# Patient Record
Sex: Female | Born: 1965 | Race: White | Hispanic: No | Marital: Married | Smoking: Former smoker
Health system: Southern US, Community
[De-identification: ages and names within clinical notes are randomized; demographics above are authoritative.]

## PROBLEM LIST (undated history)

## (undated) DIAGNOSIS — E119 Type 2 diabetes mellitus without complications: Secondary | ICD-10-CM

## (undated) DIAGNOSIS — K5790 Diverticulosis of intestine, part unspecified, without perforation or abscess without bleeding: Secondary | ICD-10-CM

## (undated) DIAGNOSIS — K589 Irritable bowel syndrome without diarrhea: Secondary | ICD-10-CM

## (undated) DIAGNOSIS — K649 Unspecified hemorrhoids: Secondary | ICD-10-CM

## (undated) DIAGNOSIS — K219 Gastro-esophageal reflux disease without esophagitis: Secondary | ICD-10-CM

## (undated) DIAGNOSIS — K760 Fatty (change of) liver, not elsewhere classified: Secondary | ICD-10-CM

## (undated) DIAGNOSIS — C3490 Malignant neoplasm of unspecified part of unspecified bronchus or lung: Secondary | ICD-10-CM

## (undated) DIAGNOSIS — Z889 Allergy status to unspecified drugs, medicaments and biological substances status: Secondary | ICD-10-CM

## (undated) DIAGNOSIS — I10 Essential (primary) hypertension: Secondary | ICD-10-CM

## (undated) HISTORY — DX: Diverticulosis of intestine, part unspecified, without perforation or abscess without bleeding: K57.90

## (undated) HISTORY — DX: Malignant neoplasm of unspecified part of unspecified bronchus or lung: C34.90

## (undated) HISTORY — DX: Gastro-esophageal reflux disease without esophagitis: K21.9

## (undated) HISTORY — DX: Allergy status to unspecified drugs, medicaments and biological substances: Z88.9

## (undated) HISTORY — DX: Essential (primary) hypertension: I10

## (undated) HISTORY — DX: Unspecified hemorrhoids: K64.9

## (undated) HISTORY — DX: Irritable bowel syndrome, unspecified: K58.9

---

## 2009-04-07 HISTORY — PX: KNEE ARTHROSCOPY: SUR90

## 2010-01-04 ENCOUNTER — Ambulatory Visit
Admission: RE | Admit: 2010-01-04 | Discharge: 2010-01-04 | Payer: Self-pay | Source: Home / Self Care | Admitting: Specialist

## 2010-06-20 LAB — POCT I-STAT 4, (NA,K, GLUC, HGB,HCT)
Glucose, Bld: 114 mg/dL — ABNORMAL HIGH (ref 70–99)
HCT: 42 % (ref 36.0–46.0)
Potassium: 3.6 mEq/L (ref 3.5–5.1)

## 2010-10-04 DIAGNOSIS — R079 Chest pain, unspecified: Secondary | ICD-10-CM

## 2011-08-06 ENCOUNTER — Encounter (INDEPENDENT_AMBULATORY_CARE_PROVIDER_SITE_OTHER): Payer: Self-pay | Admitting: *Deleted

## 2011-09-16 ENCOUNTER — Encounter (INDEPENDENT_AMBULATORY_CARE_PROVIDER_SITE_OTHER): Payer: Self-pay | Admitting: Internal Medicine

## 2011-09-16 ENCOUNTER — Ambulatory Visit (INDEPENDENT_AMBULATORY_CARE_PROVIDER_SITE_OTHER): Payer: PRIVATE HEALTH INSURANCE | Admitting: Internal Medicine

## 2011-09-16 VITALS — BP 128/72 | HR 76 | Temp 97.7°F | Resp 20 | Ht 69.0 in | Wt 338.6 lb

## 2011-09-16 DIAGNOSIS — K589 Irritable bowel syndrome without diarrhea: Secondary | ICD-10-CM | POA: Insufficient documentation

## 2011-09-16 DIAGNOSIS — E669 Obesity, unspecified: Secondary | ICD-10-CM | POA: Insufficient documentation

## 2011-09-16 DIAGNOSIS — R1031 Right lower quadrant pain: Secondary | ICD-10-CM

## 2011-09-16 DIAGNOSIS — I10 Essential (primary) hypertension: Secondary | ICD-10-CM | POA: Insufficient documentation

## 2011-09-16 DIAGNOSIS — R112 Nausea with vomiting, unspecified: Secondary | ICD-10-CM

## 2011-09-16 DIAGNOSIS — R197 Diarrhea, unspecified: Secondary | ICD-10-CM

## 2011-09-16 MED ORDER — ALIGN PO CAPS: 1.0000 | ORAL_CAPSULE | Freq: Every day | ORAL | Status: AC

## 2011-09-16 MED ORDER — DICYCLOMINE HCL 10 MG PO CAPS: 10.0000 mg | ORAL_CAPSULE | Freq: Two times a day (BID) | ORAL | Status: DC

## 2011-09-16 NOTE — Patient Instructions (Addendum)
High fiber diet. Take dicyclomine 10 mg by mouth before breakfast and lunch or before lunch and evening meal. Keep symptom diary until office visit.

## 2011-09-16 NOTE — Consult Note (Signed)
Presenting complaint; Intermittent diarrhea and abdominal pain. History of present illness; Tanya Galloway is a 46 year old Caucasian female who was referred through courtesy of Tanya Port NP/Dr. Ruthy Galloway for GI evaluation. Patient symptoms initially started 5 or 6 years ago when she developed postprandial diarrhea and was told she had irritable bowel syndrome. At that time she was being cared for at [facility] internal medicine. She was treated with dicyclomine and felt better. She did fine for about 2 years off any treatment and then developed diarrhea as well as pain at left lower quadrant of her abdomen. Her symptoms are intermittent. She had an episode that worsening pain when she was seen at [facility] internal medicine and diagnosed with diverticulitis. She recalls that her white cell count was elevated. She is not sure whether or not she had CT. She was treated with antibiotics and her acute symptoms resolved. Since then she's been experiencing intermittent episodes of diarrhea and abdominal cramps primarily across lower abdomen and also at LLQ. During these episodes she has nonbloody diarrhea for within 10 minutes of her meals. She has used Pepto-Bismol in the past which seemed to help. Her last episode was one month ago. Presently she is having one stool daily the consistency varies from normal to soft stool. She also gives history of constipation which does not occur often easily relieved with dietary changes and prunes. She also complains of intermittent bloating. She has very good appetite but trying to lose weight. This is the most that she has ever weighed. She she is having much less heartburn since she quit cigarette smoking. She denies dysphagia or nocturnal diarrhea. She wonders if her work schedule but changing shifts and eating habits contribution to her symptoms. She takes her blood pressure medication at night in past to get up multiple times to urinate. She takes no more than 3 or 4 doses of naproxen  in a month. Current medications; Diphenhydramine/acetaminophen 25/500 one tablet by mouth each bedtime when necessary. Nexium 40 mg by mouth every morning. Lisinopril/HCTZ 10/12.5 mg by mouth daily. Naproxen sodium to 20 mg by mouth daily when necessary. Phenylephrine 5 mg by mouth daily. Past medical history; History of bronchial asthma has not had an episode in several months. Hypertension of 18 months duration. Chronic GERD. Seasonal allergies. History of uterine fibroids. Obesity. History of diverticulitis as above. Left knee arthroscopy in September 2011. Allergies; Sulfa. Family history; Both parents have hypertension. Father is 23 years old and mother is 28. They both have had colonic polyps removed. She has 2 brothers in good health and one is hypertensive. Social history; She is married does not have any children. She smoked cigarettes for 20 years more than a pack per day but quit 2 years ago. She does not drink alcohol. 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 Objective; BP 128/72  Pulse 76  Temp(Src) 97.7 F (36.5 C) (Oral)  Resp 20  Ht 5\' 9"  (1.753 m)  Wt 338 lb 9.6 oz (153.588 kg)  BMI 50.00 kg/m2  LMP 08/25/2011 Conjunctival is pink. Sclerae nonicteric.  Oral pharyngeal mucosa is normal. No neck masses or thyromegaly noted.. Cardiac exam regular rhythm normal S1 and S2. No murmur or gallop noted. Lungs are clear to auscultation. Abdomen is obese. Bowel sounds are normal. On palpation is soft and nontender without organomegaly or masses. Rectal examination deferred. No clubbing or peripheral  edema noted. Assessment; Tanya Galloway is a 46 year old Caucasian female who presents with intermittent nonbloody diarrhea associated with urgency and abdominal pain predominantly in the left lower quadrant of her abdomen. She also has been treated  for diverticulitis in the past but not within the last 2-3 years. She has not had an episode of diarrhea or abdominal pain for the last one month. she does not have any alarm symptoms. Her recurrent symptoms would appear to be secondary to irritable bowel syndrome. She may have to be evaluated while she is having acute symptoms determine whether she has additional robin or diagnosis. Chronic GERD. She appears to be doing well with dietary measures and Nexium. She must double her efforts to lose weight. Recommendations; Will request abdominal CT or other imaging studies. Dicyclomine 10 mg by mouth before breakfast and lunch. Continue high fiber diet. Align one capsule by mouth daily; samples given. Symptom diary until office visit in 12 weeks. She will call for earlier appointment should she develop acute symptoms. We appreciate the opportunity to participate in the care of this nice lady.   Marland Kitchen

## 2011-09-20 NOTE — Consult Note (Addendum)
Presenting complaint; Intermittent diarrhea and abdominal pain. History of present illness; Tanya Galloway is a 46-year-old Caucasian female who was referred through courtesy of Tanya Pace NP/Tanya Galloway for GI evaluation. Patient symptoms initially started 5 or 6 years ago when she developed postprandial diarrhea and was told she had irritable bowel syndrome. At that time she was being [facility]. She was treated with dicyclomine and felt better. She did fine for about 2 years off any treatment and then developed diarrhea as well as pain at left lower quadrant of her abdomen. Her symptoms are intermittent. She had an episode that worsening pain when she was seen at [facility] and diagnosed with diverticulitis. She recalls that her white cell count was elevated. She is not sure whether or not she had CT. She was treated with antibiotics and her acute symptoms resolved. Since then she's been experiencing intermittent episodes of diarrhea and abdominal cramps primarily across lower abdomen and also at LLQ. During these episodes she has nonbloody diarrhea for within 10 minutes of her meals. She has used Pepto-Bismol in the past which seemed to help. Her last episode was one month ago. Presently she is having one stool daily the consistency varies from normal to soft stool. She also gives history of constipation which does not occur often easily relieved with dietary changes and prunes. She also complains of intermittent bloating. She has very good appetite but trying to lose weight. This is the most that she has ever weighed. She she is having much less heartburn since she quit cigarette smoking. She denies dysphagia or nocturnal diarrhea. She wonders if her work schedule but changing shifts and eating habits contribution to her symptoms. She takes her blood pressure medication at night in past to get up multiple times to urinate. She takes no more than 3 or 4 doses of naproxen  in a month. Current medications; Diphenhydramine/acetaminophen 25/500 one tablet by mouth each bedtime when necessary. Nexium 40 mg by mouth every morning. Lisinopril/HCTZ 10/12.5 mg by mouth daily. Naproxen sodium to 20 mg by mouth daily when necessary. Phenylephrine 5 mg by mouth daily. Past medical history; History of bronchial asthma has not had an episode in several months. Hypertension of 18 months duration. Chronic GERD. Seasonal allergies. History of uterine fibroids. Obesity. History of diverticulitis as above. Left knee arthroscopy in September 2011. Allergies; Sulfa. Family history; Both parents have hypertension. Father is 76 years old and mother is 74. They both have had colonic polyps removed. She has 2 brothers in good health and one is hypertensive. Social history; She is married does not have any children. She smoked cigarettes for 20 years more than a pack per day but quit 2 years ago. She does not drink alcohol. 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 Objective; BP 128/72  Pulse 76  Temp(Src) 97.7 F (36.5 C) (Oral)  Resp 20  Ht 5' 9" (1.753 m)  Wt 338 lb 9.6 oz (153.588 kg)  BMI 50.00 kg/m2  LMP 08/25/2011 Conjunctival is pink. Sclerae nonicteric.  Oral pharyngeal mucosa is normal. No neck masses or thyromegaly noted.. Cardiac exam regular rhythm normal S1 and S2. No murmur or gallop noted. Lungs are clear to auscultation. Abdomen is obese. Bowel sounds are normal. On palpation is soft and nontender without organomegaly or masses. Rectal examination deferred. No clubbing or peripheral   edema noted. Assessment; Tanya Galloway is a 46-year-old Caucasian female who presents with intermittent nonbloody diarrhea associated with urgency and abdominal pain predominantly in the left lower quadrant of her abdomen. She also has been treated  for diverticulitis in the past but not within the last 2-3 years. She has not had an episode of diarrhea or abdominal pain for the last one month. she does not have any alarm symptoms. Her recurrent symptoms would appear to be secondary to irritable bowel syndrome. She may have to be evaluated while she is having acute symptoms determine whether she has additional robin or diagnosis. Chronic GERD. She appears to be doing well with dietary measures and Nexium. She must double her efforts to lose weight. Recommendations; Will request abdominal CT or other imaging studies. Dicyclomine 10 mg by mouth before breakfast and lunch. Continue high fiber diet. Align one capsule by mouth daily; samples given. Symptom diary until office visit in 12 weeks. She will call for earlier appointment should she develop acute symptoms. We appreciate the opportunity to participate in the care of this nice lady.   .  

## 2011-09-23 NOTE — Progress Notes (Signed)
Presenting complaint; Intermittent diarrhea and abdominal pain. History of present illness; Tanya Galloway is a 46-year-old Caucasian female who was referred through courtesy of Tanya Pace NP/Tanya Galloway for GI evaluation. Patient symptoms initially started 5 or 6 years ago when she developed postprandial diarrhea and was told she had irritable bowel syndrome. At that time she was being cared for at [facility]. She was treated with dicyclomine and felt better. She did fine for about 2 years off any treatment and then developed diarrhea as well as pain at left lower quadrant of her abdomen. Her symptoms are intermittent. She had an episode that worsening pain when she was seen at [facility] and diagnosed with diverticulitis. She recalls that her white cell count was elevated. She is not sure whether or not she had CT. She was treated with antibiotics and her acute symptoms resolved. Since then she's been experiencing intermittent episodes of diarrhea and abdominal cramps primarily across lower abdomen and also at LLQ. During these episodes she has nonbloody diarrhea for within 10 minutes of her meals. She has used Pepto-Bismol in the past which seemed to help. Her last episode was one month ago. Presently she is having one stool daily the consistency varies from normal to soft stool. She also gives history of constipation which does not occur often easily relieved with dietary changes and prunes. She also complains of intermittent bloating. She has very good appetite but trying to lose weight. This is the most that she has ever weighed. She she is having much less heartburn since she quit cigarette smoking. She denies dysphagia or nocturnal diarrhea. She wonders if her work schedule but changing shifts and eating habits contribution to her symptoms. She takes her blood pressure medication at night in past to get up multiple times to urinate. She takes no more than 3 or 4 doses of naproxen  in a month. Current medications; Diphenhydramine/acetaminophen 25/500 one tablet by mouth each bedtime when necessary. Nexium 40 mg by mouth every morning. Lisinopril/HCTZ 10/12.5 mg by mouth daily. Naproxen sodium to 20 mg by mouth daily when necessary. Phenylephrine 5 mg by mouth daily. Past medical history; History of bronchial asthma has not had an episode in several months. Hypertension of 18 months duration. Chronic GERD. Seasonal allergies. History of uterine fibroids. Obesity. History of diverticulitis as above. Left knee arthroscopy in September 2011. Allergies; Sulfa. Family history; Both parents have hypertension. Father is 76 years old and mother is 74. They both have had colonic polyps removed. She has 2 brothers in good health and one is hypertensive. Social history; She is married does not have any children. She smoked cigarettes for 20 years more than a pack per day but quit 2 years ago. She does not drink alcohol. 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 Objective; BP 128/72  Pulse 76  Temp(Src) 97.7 F (36.5 C) (Oral)  Resp 20  Ht 5' 9" (1.753 m)  Wt 338 lb 9.6 oz (153.588 kg)  BMI 50.00 kg/m2  LMP 08/25/2011 Conjunctival is pink. Sclerae nonicteric.  Oral pharyngeal mucosa is normal. No neck masses or thyromegaly noted.. Cardiac exam regular rhythm normal S1 and S2. No murmur or gallop noted. Lungs are clear to auscultation. Abdomen is obese. Bowel sounds are normal. On palpation is soft and nontender without organomegaly or masses. Rectal examination deferred. No clubbing or peripheral   edema noted. Assessment; Tanya Galloway is a 46-year-old Caucasian female who presents with intermittent nonbloody diarrhea associated with urgency and abdominal pain predominantly in the left lower quadrant of her abdomen. She also has been treated  for diverticulitis in the past but not within the last 2-3 years. She has not had an episode of diarrhea or abdominal pain for the last one month. she does not have any alarm symptoms. Her recurrent symptoms would appear to be secondary to irritable bowel syndrome. She may have to be evaluated while she is having acute symptoms determine whether she has additional robin or diagnosis. Chronic GERD. She appears to be doing well with dietary measures and Nexium. She must double her efforts to lose weight. Recommendations; Will request abdominal CT or other imaging studies. Dicyclomine 10 mg by mouth before breakfast and lunch. Continue high fiber diet. Align one capsule by mouth daily; samples given. Symptom diary until office visit in 12 weeks. She will call for earlier appointment should she develop acute symptoms. We appreciate the opportunity to participate in the care of this nice lady.   .  

## 2011-11-05 ENCOUNTER — Telehealth (INDEPENDENT_AMBULATORY_CARE_PROVIDER_SITE_OTHER): Payer: Self-pay | Admitting: *Deleted

## 2011-11-05 NOTE — Telephone Encounter (Signed)
Patient was called ,message was left on her voicemail, that her hemoccult was negative. Dr.Rehman will be made and any further recommendations either Dr.Rehman or myself would call her back.

## 2011-11-13 ENCOUNTER — Telehealth: Payer: Self-pay | Admitting: *Deleted

## 2011-11-13 DIAGNOSIS — K589 Irritable bowel syndrome without diarrhea: Secondary | ICD-10-CM

## 2011-11-13 NOTE — Telephone Encounter (Signed)
Called pt and told her to contact Dr Patty Sermons office

## 2011-11-13 NOTE — Telephone Encounter (Signed)
This is a Rehman pt.  She has not been seen here. Please call her & ask her to contact them.Thanks

## 2011-11-13 NOTE — Telephone Encounter (Signed)
Tanya Galloway called today. She needs a refill on her dicyclomine. She uses [facility] in [place]. They were suppose to send over the request. She has 2 pills left. Thanks.

## 2011-12-09 ENCOUNTER — Ambulatory Visit (INDEPENDENT_AMBULATORY_CARE_PROVIDER_SITE_OTHER): Payer: PRIVATE HEALTH INSURANCE | Admitting: Internal Medicine

## 2011-12-12 ENCOUNTER — Encounter (INDEPENDENT_AMBULATORY_CARE_PROVIDER_SITE_OTHER): Payer: Self-pay

## 2011-12-15 ENCOUNTER — Ambulatory Visit (INDEPENDENT_AMBULATORY_CARE_PROVIDER_SITE_OTHER): Payer: PRIVATE HEALTH INSURANCE | Admitting: Internal Medicine

## 2011-12-15 ENCOUNTER — Encounter (INDEPENDENT_AMBULATORY_CARE_PROVIDER_SITE_OTHER): Payer: Self-pay | Admitting: Internal Medicine

## 2011-12-15 VITALS — BP 126/72 | HR 76 | Temp 97.4°F | Resp 20 | Ht 69.0 in | Wt 327.4 lb

## 2011-12-15 DIAGNOSIS — K219 Gastro-esophageal reflux disease without esophagitis: Secondary | ICD-10-CM | POA: Insufficient documentation

## 2011-12-15 DIAGNOSIS — K589 Irritable bowel syndrome without diarrhea: Secondary | ICD-10-CM

## 2011-12-15 MED ORDER — ESOMEPRAZOLE MAGNESIUM 40 MG PO CPDR: 40.0000 mg | DELAYED_RELEASE_CAPSULE | Freq: Every day | ORAL | Status: DC

## 2011-12-15 MED ORDER — DICYCLOMINE HCL 10 MG PO CAPS: 10.0000 mg | ORAL_CAPSULE | Freq: Two times a day (BID) | ORAL | Status: DC

## 2011-12-15 NOTE — Patient Instructions (Addendum)
Starting in January 2014 use dicyclomine on as-needed basis.

## 2011-12-15 NOTE — Progress Notes (Signed)
Presenting complaint;  Followup for diarrhea and abdominal pain. Subjective:  Patient is 46 year old Caucasian female who was initially seen 3 months ago for intermittent diarrhea and abdominal cramps. She was felt to have irritable bowel syndrome. We did review her records and determine that she never had abdominopelvic CT. She feels a lot better. She Tarry for a few weeks but then stopped doing it since he was not having diarrhea or abdominal pain. She is having 1-2 formed stools daily. Early on she had transient problem with constipation but it has resolved. She has lost 11 pounds since her last visit. Her breakfast and lunch include a shake which has fiber  In it and she has fruit for snack in between she has a regular meal in the evening. She is having no side effects with dicyclomine. She would like to get new prescription for Nexium. She was initially begun on this medication by Dr. Donzetta Sprung a she does not have appointment in near future.  Current Medications: Current Outpatient Prescriptions  Medication Sig Dispense Refill  . bifidobacterium infantis (ALIGN) capsule Take 1 capsule by mouth daily.  28 capsule  0  . dicyclomine (BENTYL) 10 MG capsule Take 1 capsule (10 mg total) by mouth 2 (two) times daily before a meal.  90 capsule  0  . diphenhydramine-acetaminophen (TYLENOL PM) 25-500 MG TABS Take 1 tablet by mouth at bedtime as needed.      Marland Kitchen esomeprazole (NEXIUM) 40 MG capsule Take 40 mg by mouth daily before breakfast.      . lisinopril-hydrochlorothiazide (PRINZIDE,ZESTORETIC) 10-12.5 MG per tablet Take 1 tablet by mouth daily.      . naproxen sodium (ANAPROX) 220 MG tablet Take 220 mg by mouth as needed.      . phenylephrine (SUDAFED PE) 10 MG TABS Take 5 mg by mouth daily.         Objective: Blood pressure 126/72, pulse 76, temperature 97.4 F (36.3 C), temperature source Oral, resp. rate 20, height 5\' 9"  (1.753 m), weight 327 lb 6.4 oz (148.508 kg), last menstrual period  12/07/2011. Conjunctiva is pink. Sclera is nonicteric Oropharyngeal mucosa is normal. No neck masses or thyromegaly noted. Abdomen is full. Bowel sounds are normal. On palpation soft abdomen without tenderness organomegaly or masses.  No LE edema or clubbing noted.    Assessment: #1. Irritable bowel syndrome. She has responded nicely to therapy with resolution of her diarrhea and abdominal pain. She should continue dicyclomine for  few more months and there after use it on when necessary basis. She should continue intake of fiber rich foods and align for now. #2. GERD. Symptoms well controlled with Nexium.   Plan:  Continue dicyclomine at current dose for another 4 months and thereafter on as-needed basis. New prescription for dicyclomine sent to her pharmacy. New prescription for Nexium 40 mg by mouth every morning one month with 11 refills sent to her pharmacy. Office visit on as-needed basis. However if she needs to stay on dicyclomine twice a day should return for follow visit in one year.

## 2012-09-23 ENCOUNTER — Telehealth (HOSPITAL_COMMUNITY): Payer: Self-pay | Admitting: Dietician

## 2012-09-23 NOTE — Telephone Encounter (Signed)
Received voicemail from pt left at [phone]. Pt requesting to attend DM class next Thursday, however, class is not scheduled due to provider absence. Called back and left message on pt voicemail at [phone] and provided information on next 2 available class dates on 7/1 and 7/10.

## 2012-10-04 NOTE — Telephone Encounter (Signed)
No response from pt. Will close out.  

## 2012-12-28 ENCOUNTER — Other Ambulatory Visit: Payer: Self-pay

## 2012-12-28 DIAGNOSIS — Z1231 Encounter for screening mammogram for malignant neoplasm of breast: Secondary | ICD-10-CM

## 2013-01-24 ENCOUNTER — Ambulatory Visit
Admission: RE | Admit: 2013-01-24 | Discharge: 2013-01-24 | Disposition: A | Discharge status: Home / Self Care | Payer: PRIVATE HEALTH INSURANCE | Source: Ambulatory Visit

## 2013-01-24 DIAGNOSIS — Z1231 Encounter for screening mammogram for malignant neoplasm of breast: Secondary | ICD-10-CM

## 2013-04-07 ENCOUNTER — Other Ambulatory Visit (INDEPENDENT_AMBULATORY_CARE_PROVIDER_SITE_OTHER): Payer: Self-pay | Admitting: Internal Medicine

## 2013-07-18 ENCOUNTER — Ambulatory Visit (INDEPENDENT_AMBULATORY_CARE_PROVIDER_SITE_OTHER): Payer: BC Managed Care – PPO | Admitting: Internal Medicine

## 2013-07-18 ENCOUNTER — Encounter (INDEPENDENT_AMBULATORY_CARE_PROVIDER_SITE_OTHER): Payer: Self-pay | Admitting: Internal Medicine

## 2013-07-18 VITALS — BP 124/72 | HR 78 | Temp 97.0°F | Resp 20 | Ht 69.0 in | Wt 327.8 lb

## 2013-07-18 DIAGNOSIS — K219 Gastro-esophageal reflux disease without esophagitis: Secondary | ICD-10-CM

## 2013-07-18 DIAGNOSIS — R109 Unspecified abdominal pain: Secondary | ICD-10-CM

## 2013-07-18 DIAGNOSIS — K589 Irritable bowel syndrome without diarrhea: Secondary | ICD-10-CM

## 2013-07-18 MED ORDER — DICYCLOMINE HCL 10 MG PO CAPS: 10.0000 mg | ORAL_CAPSULE | Freq: Two times a day (BID) | ORAL | Status: AC

## 2013-07-18 NOTE — Patient Instructions (Signed)
Call if abdominal pain not resolved within two weeks or if it gets worse

## 2013-07-18 NOTE — Progress Notes (Signed)
Presenting complaint;  Followup for GERD and IBS.  Subjective:  Patient is 48 year old Caucasian female who presents for scheduled visit. She was last seen on 12/15/2011. She has had symptoms of heartburn for 5 years. She is watching her diet. She is now using OTC Nexium every other day and she believes her heart went well controlled. She denies dysphagia sore throat, cough or hoarseness. She is taking She did try to stop the dicyclomine but her diarrhea and cramps relapse. She denies melena or rectal bleeding. She hasn't lost any weight since last visit. She says she gained over 40 pounds when she quit cigarette smoking but hasn't been successful at weight loss. She used to go through wire for exercises and swimming but this has been on hold since her job hours have changed. She has noted intermittent pain in the right upper quadrant laterally below the costal margin. Spleen is not associated with nausea vomiting hematuria or dysuria. Pain seemed to get worse with certain movements.   Current Medications: Outpatient Encounter Prescriptions as of 07/18/2013  Medication Sig  . dicyclomine (BENTYL) 10 MG capsule TAKE ONE CAPSULE BY MOUTH TWICE DAILY BEFORE A MEAL  . diphenhydramine-acetaminophen (TYLENOL PM) 25-500 MG TABS Take 1 tablet by mouth at bedtime as needed.  Marland Kitchen esomeprazole (NEXIUM) 40 MG capsule Take 1 capsule (40 mg total) by mouth daily before breakfast.  . lisinopril-hydrochlorothiazide (PRINZIDE,ZESTORETIC) 20-12.5 MG per tablet Take 1 tablet by mouth daily.  . naproxen sodium (ANAPROX) 220 MG tablet Take 220 mg by mouth as needed.  Marland Kitchen OVER THE COUNTER MEDICATION [facility]  . phenylephrine (SUDAFED PE) 10 MG TABS Take 5 mg by mouth daily.  . traMADol (ULTRAM) 50 MG tablet Take 50 mg by mouth as needed. For back pain  . [DISCONTINUED] lisinopril-hydrochlorothiazide (PRINZIDE,ZESTORETIC) 10-12.5 MG per tablet Take 1 tablet by mouth daily.     Objective: Blood  pressure 124/72, pulse 78, temperature 97 F (36.1 C), temperature source Oral, resp. rate 20, height 5\' 9"  (1.753 m), weight 327 lb 12.8 oz (148.689 kg), last menstrual period 06/28/2013. Patient is alert and in no acute distress. Conjunctiva is pink. Sclera is nonicteric Oropharyngeal mucosa is normal. No neck masses or thyromegaly noted. Cardiac exam with regular rhythm normal S1 and S2. No murmur or gallop noted. Lungs are clear to auscultation. Abdomen is obese. On palpation is soft with mild tenderness in right abdomen laterally below the costal margin. Tenderness appears to be superficial. No organomegaly or masses.  No LE edema or clubbing noted.   Assessment:  #1. GERD. She has typical symptoms. She is requiring less amount of medication now than she did in the past. Will continue with current regimen and as long as it is working. Since she has had symptoms for 5 years she may consider EGD at the time of screening colonoscopy in 4 years. #2. Irritable bowel syndrome. Symptoms are well-controlled the dicyclomine. #3. Right sided abdominal pain appears to be wall pain. If this pain continues she will need further workup.    Plan: Patient advised that she must resume regular exercise in order to lose weight. New prescription for dicyclomine 10 mg by mouth twice a day given for 3 months with refills. Office visit in one year.

## 2014-04-04 ENCOUNTER — Encounter (INDEPENDENT_AMBULATORY_CARE_PROVIDER_SITE_OTHER): Payer: Self-pay | Admitting: *Deleted

## 2014-07-25 ENCOUNTER — Ambulatory Visit (INDEPENDENT_AMBULATORY_CARE_PROVIDER_SITE_OTHER): Payer: BC Managed Care – PPO | Admitting: Internal Medicine

## 2014-08-29 ENCOUNTER — Ambulatory Visit (INDEPENDENT_AMBULATORY_CARE_PROVIDER_SITE_OTHER): Payer: BLUE CROSS/BLUE SHIELD | Admitting: Internal Medicine

## 2014-08-29 ENCOUNTER — Encounter (INDEPENDENT_AMBULATORY_CARE_PROVIDER_SITE_OTHER): Payer: Self-pay | Admitting: Internal Medicine

## 2014-08-29 VITALS — BP 112/72 | HR 72 | Temp 98.1°F | Ht 70.0 in | Wt 324.9 lb

## 2014-08-29 DIAGNOSIS — K219 Gastro-esophageal reflux disease without esophagitis: Secondary | ICD-10-CM

## 2014-08-29 NOTE — Patient Instructions (Signed)
Continue the Nexium daily. OV in 1 year

## 2014-08-29 NOTE — Progress Notes (Signed)
   Subjective:    Patient ID: Tanya Galloway, female    DOB: 1965/08/14, 49 y.o.   MRN: [id number]  HPI Here today for f/u of her chronic GERD. She was last seen by Dr. Laural Golden in April of last year. She tells me she is doing good.  Sometimes her acid is sometimes rough. She has not been taking her Nexium daily. She has been taking on a prn basis.  She says she saw Dr.   Arcola Jansky for a possible UTI. She says she had sugar in her urine. Appetite is good. No weight loss.  HA1C 11 in January HA1C  7 in March.   She is having a BM daily. No urgency with the Dicyclomine. No change in her stools. No melena or BRRB.   Weight 327.   Review of Systems Married. No children. Works at [employer] in [employer].     Past Medical History  Diagnosis Date  . GERD (gastroesophageal reflux disease)   . Irritable bowel syndrome   . Diverticulosis   . H/O seasonal allergies   . Hypertension   . Hemorrhoids   . Borderline diabetic     Past Surgical History  Procedure Laterality Date  . Knee arthroscopy  2011    left knee    Allergies  Allergen Reactions  . Sulfur Other (See Comments)    Patient experienced Nausea and Swelling    Current Outpatient Prescriptions on File Prior to Visit  Medication Sig Dispense Refill  . dicyclomine (BENTYL) 10 MG capsule Take 1 capsule (10 mg total) by mouth 2 (two) times daily before a meal. 180 capsule 3  . esomeprazole (NEXIUM) 40 MG capsule Take 1 capsule (40 mg total) by mouth daily before breakfast. 30 capsule 11  . lisinopril-hydrochlorothiazide (PRINZIDE,ZESTORETIC) 20-12.5 MG per tablet Take 1 tablet by mouth daily.    . naproxen sodium (ANAPROX) 220 MG tablet Take 220 mg by mouth as needed.    . phenylephrine (SUDAFED PE) 10 MG TABS Take 5 mg by mouth every 6 (six) hours as needed.     . traMADol (ULTRAM) 50 MG tablet Take 50 mg by mouth as needed. For back pain     No current facility-administered medications on file prior to visit.       Objective:   Physical Exam Blood pressure 112/72, pulse 72, temperature 98.1 F (36.7 C), height '5\' 10"'$  (1.778 m), weight 324 lb 14.4 oz (147.374 kg). Alert and oriented. Skin warm and dry. Oral mucosa is moist.   . Sclera anicteric, conjunctivae is pink. Thyroid not enlarged. No cervical lymphadenopathy. Lungs clear. Heart regular rate and rhythm.  Abdomen is soft. Bowel sounds are positive. No hepatomegaly. No abdominal masses felt. No tenderness.  No edema to lower extremities.          Assessment & Plan:  GERD. She will take her Nexium  OV in 1 year. She will follow up with Dr. Olena Heckle concerning her elevated HA1C.

## 2014-09-11 ENCOUNTER — Telehealth (INDEPENDENT_AMBULATORY_CARE_PROVIDER_SITE_OTHER): Payer: Self-pay | Admitting: *Deleted

## 2014-09-11 NOTE — Telephone Encounter (Signed)
Maleeha said she just seen Tanya Galloway and didn't have anything going on. She started this past weekend having Loss Stools, Discomfort in Belly & Nauseated. Her return phone number is [phone] or [phone].

## 2014-09-19 NOTE — Telephone Encounter (Signed)
Message left at home 

## 2015-02-22 ENCOUNTER — Other Ambulatory Visit: Payer: Self-pay

## 2015-02-22 DIAGNOSIS — Z1231 Encounter for screening mammogram for malignant neoplasm of breast: Secondary | ICD-10-CM

## 2015-03-30 ENCOUNTER — Ambulatory Visit
Admission: RE | Admit: 2015-03-30 | Discharge: 2015-03-30 | Disposition: A | Discharge status: Home / Self Care | Payer: BLUE CROSS/BLUE SHIELD | Source: Ambulatory Visit

## 2015-03-30 DIAGNOSIS — Z1231 Encounter for screening mammogram for malignant neoplasm of breast: Secondary | ICD-10-CM

## 2015-05-14 ENCOUNTER — Encounter (INDEPENDENT_AMBULATORY_CARE_PROVIDER_SITE_OTHER): Payer: Self-pay | Admitting: Internal Medicine

## 2015-08-29 ENCOUNTER — Ambulatory Visit (INDEPENDENT_AMBULATORY_CARE_PROVIDER_SITE_OTHER): Payer: BLUE CROSS/BLUE SHIELD | Admitting: Internal Medicine

## 2015-10-01 ENCOUNTER — Telehealth (INDEPENDENT_AMBULATORY_CARE_PROVIDER_SITE_OTHER): Payer: Self-pay | Admitting: Internal Medicine

## 2015-10-01 NOTE — Telephone Encounter (Signed)
Patient called, stated that she is having trouble going to the bathroom.  She stated that she has IBS and wants to know with her having that is it ok to use [facility] generic stool softener.  She is also having stomach pain.  Is there anything else she should do?  [phone]

## 2015-10-01 NOTE — Telephone Encounter (Signed)
Message left stating she could take a stool softener. Can try backing off the Dicyclomine to one a day and see how she does.

## 2015-10-04 ENCOUNTER — Ambulatory Visit (INDEPENDENT_AMBULATORY_CARE_PROVIDER_SITE_OTHER): Payer: PRIVATE HEALTH INSURANCE | Admitting: Internal Medicine

## 2015-10-04 ENCOUNTER — Encounter (INDEPENDENT_AMBULATORY_CARE_PROVIDER_SITE_OTHER): Payer: Self-pay | Admitting: Internal Medicine

## 2015-10-04 VITALS — BP 112/72 | HR 64 | Temp 97.9°F | Ht 70.0 in | Wt 330.0 lb

## 2015-10-04 DIAGNOSIS — K5909 Other constipation: Secondary | ICD-10-CM

## 2015-10-04 NOTE — Progress Notes (Signed)
   Subjective:    Patient ID: Tanya Galloway, female    DOB: 12-Oct-1965, 50 y.o.   MRN: [id number]  HPI She tells me Monday she was constipated. She took a mild stool softener. Ate prunes and apple juice.She c/o  gas and had pressure . She had pain in her pelvic area.  She tells me this morning she had a regular BM. No melena or BRRB.  She felt much better after having a normal BM.  Her appetite is good. No weight loss. She has actually gained 6 pound since her last visit in January She admits to not exercising but is going to start going to the [facility] to swim. Her HA1C elevated at around 7 and has talked with a Nutritionist. She is very interested in loosing weight.      Review of Systems Past Medical History  Diagnosis Date  . GERD (gastroesophageal reflux disease)   . Irritable bowel syndrome   . Diverticulosis   . H/O seasonal allergies   . Hypertension   . Hemorrhoids   . Borderline diabetic     Past Surgical History  Procedure Laterality Date  . Knee arthroscopy  2011    left knee    Allergies  Allergen Reactions  . Sulfur Other (See Comments)    Patient experienced Nausea and Swelling    Current Outpatient Prescriptions on File Prior to Visit  Medication Sig Dispense Refill  . dicyclomine (BENTYL) 10 MG capsule Take 1 capsule (10 mg total) by mouth 2 (two) times daily before a meal. 180 capsule 3  . esomeprazole (NEXIUM) 40 MG capsule Take 1 capsule (40 mg total) by mouth daily before breakfast. 30 capsule 11  . lisinopril-hydrochlorothiazide (PRINZIDE,ZESTORETIC) 20-12.5 MG per tablet Take 1 tablet by mouth daily.    . naproxen sodium (ANAPROX) 220 MG tablet Take 220 mg by mouth as needed.    . phenylephrine (SUDAFED PE) 10 MG TABS Take 5 mg by mouth every 6 (six) hours as needed.     . traMADol (ULTRAM) 50 MG tablet Take 50 mg by mouth as needed. For back pain     No current facility-administered medications on file prior to visit.        Objective:   Physical  Exam Blood pressure 112/72, pulse 64, temperature 97.9 F (36.6 C), height '5\' 10"'$  (1.778 m), weight 330 lb (149.687 kg). Alert and oriented. Skin warm and dry. Oral mucosa is moist.   . Sclera anicteric, conjunctivae is pink. Thyroid not enlarged. No cervical lymphadenopathy. Lungs clear. Heart regular rate and rhythm.  Abdomen is soft. Bowel sounds are positive. No hepatomegaly. No abdominal masses felt. No tenderness. Abdomen obese.  No edema to lower extremities.          Assessment & Plan:  Constipation. Stop the Dicyclomine for now unless diarrhea reoccurs.  Metamucil daily.

## 2015-10-04 NOTE — Patient Instructions (Addendum)
Metamucil daily. OV in 1 year.

## 2015-10-31 ENCOUNTER — Ambulatory Visit (INDEPENDENT_AMBULATORY_CARE_PROVIDER_SITE_OTHER): Payer: BLUE CROSS/BLUE SHIELD | Admitting: Internal Medicine

## 2015-11-21 ENCOUNTER — Ambulatory Visit (INDEPENDENT_AMBULATORY_CARE_PROVIDER_SITE_OTHER): Payer: BLUE CROSS/BLUE SHIELD | Admitting: Internal Medicine

## 2015-11-30 ENCOUNTER — Other Ambulatory Visit (HOSPITAL_COMMUNITY): Payer: Self-pay | Admitting: Specialist

## 2015-11-30 ENCOUNTER — Ambulatory Visit (HOSPITAL_COMMUNITY)
Admission: RE | Admit: 2015-11-30 | Discharge: 2015-11-30 | Disposition: A | Discharge status: Home / Self Care | Payer: PRIVATE HEALTH INSURANCE | Source: Ambulatory Visit | Attending: Specialist | Admitting: Specialist

## 2015-11-30 DIAGNOSIS — M7989 Other specified soft tissue disorders: Principal | ICD-10-CM

## 2015-11-30 DIAGNOSIS — M79604 Pain in right leg: Secondary | ICD-10-CM | POA: Diagnosis not present

## 2015-11-30 DIAGNOSIS — M79661 Pain in right lower leg: Secondary | ICD-10-CM

## 2016-04-03 ENCOUNTER — Telehealth (INDEPENDENT_AMBULATORY_CARE_PROVIDER_SITE_OTHER): Payer: Self-pay | Admitting: Internal Medicine

## 2016-04-03 NOTE — Telephone Encounter (Signed)
Patient called, stated that she has had pain in her left side for 2 weeks.  Concerned with diverticulitis.  She wants to speak to you, to get advice, does she need to come in.  [phone]

## 2016-04-03 NOTE — Telephone Encounter (Signed)
Has possible UTI. F/u wit hDr. Quillian Quince

## 2016-05-16 ENCOUNTER — Other Ambulatory Visit: Payer: Self-pay | Admitting: Family Medicine

## 2016-05-16 DIAGNOSIS — Z1231 Encounter for screening mammogram for malignant neoplasm of breast: Secondary | ICD-10-CM

## 2016-06-04 ENCOUNTER — Ambulatory Visit
Admission: RE | Admit: 2016-06-04 | Discharge: 2016-06-04 | Disposition: A | Discharge status: Home / Self Care | Payer: PRIVATE HEALTH INSURANCE | Source: Ambulatory Visit | Attending: Family Medicine | Admitting: Family Medicine

## 2016-06-04 DIAGNOSIS — Z1231 Encounter for screening mammogram for malignant neoplasm of breast: Secondary | ICD-10-CM

## 2016-09-16 ENCOUNTER — Encounter (INDEPENDENT_AMBULATORY_CARE_PROVIDER_SITE_OTHER): Payer: Self-pay | Admitting: Internal Medicine

## 2016-10-03 ENCOUNTER — Ambulatory Visit (INDEPENDENT_AMBULATORY_CARE_PROVIDER_SITE_OTHER): Payer: PRIVATE HEALTH INSURANCE | Admitting: Internal Medicine

## 2016-10-16 ENCOUNTER — Ambulatory Visit (INDEPENDENT_AMBULATORY_CARE_PROVIDER_SITE_OTHER): Payer: PRIVATE HEALTH INSURANCE | Admitting: Internal Medicine

## 2016-10-16 ENCOUNTER — Encounter (INDEPENDENT_AMBULATORY_CARE_PROVIDER_SITE_OTHER): Payer: Self-pay | Admitting: Internal Medicine

## 2016-10-16 VITALS — BP 108/70 | HR 72 | Temp 98.1°F | Ht 68.0 in | Wt 319.7 lb

## 2016-10-16 DIAGNOSIS — K219 Gastro-esophageal reflux disease without esophagitis: Secondary | ICD-10-CM | POA: Diagnosis not present

## 2016-10-16 NOTE — Progress Notes (Addendum)
   Subjective:    Patient ID: Tanya Galloway, female    DOB: 1966/03/08, 51 y.o.   MRN: [id number]  HPI Here today for f/u. Last seen in June of 2017. Hx of constipation. Eats prunes and drinks apple juice for her constipation.  She states she has had some times gas in her esophagus. She says refried beans upset her. Appetite is good. No weight loss.  Appetite is good.  No family hx of colon cancer. No problems with her BMs.        Review of Systems Past Medical History:  Diagnosis Date  . Borderline diabetic   . Diverticulosis   . GERD (gastroesophageal reflux disease)   . H/O seasonal allergies   . Hemorrhoids   . Hypertension   . Irritable bowel syndrome     Past Surgical History:  Procedure Laterality Date  . KNEE ARTHROSCOPY  2011   left knee    Allergies  Allergen Reactions  . Sulfur Other (See Comments)    Patient experienced Nausea and Swelling    Current Outpatient Prescriptions on File Prior to Visit  Medication Sig Dispense Refill  . esomeprazole (NEXIUM) 40 MG capsule Take 1 capsule (40 mg total) by mouth daily before breakfast. 30 capsule 11  . lisinopril-hydrochlorothiazide (PRINZIDE,ZESTORETIC) 20-12.5 MG per tablet Take 1 tablet by mouth daily.    . naproxen sodium (ANAPROX) 220 MG tablet Take 220 mg by mouth as needed.    . phenylephrine (SUDAFED PE) 10 MG TABS Take 5 mg by mouth every 6 (six) hours as needed.     . traMADol (ULTRAM) 50 MG tablet Take 50 mg by mouth as needed. For back pain    . dicyclomine (BENTYL) 10 MG capsule Take 1 capsule (10 mg total) by mouth 2 (two) times daily before a meal. (Patient not taking: Reported on 10/16/2016) 180 capsule 3   No current facility-administered medications on file prior to visit.         Objective:   Physical Exam Blood pressure 108/70, pulse 72, temperature 98.1 F (36.7 C), height 5\' 8"  (1.727 m), weight (!) 319 lb 11.2 oz (145 kg). Alert and oriented. Skin warm and dry. Oral mucosa is moist.   .  Sclera anicteric, conjunctivae is pink. Thyroid not enlarged. No cervical lymphadenopathy. Lungs clear. Heart regular rate and rhythm.  Abdomen is soft. Bowel sounds are positive. No hepatomegaly. No abdominal masses felt. No tenderness.  No edema to lower extremities. Patient is alert and oriented.         Assessment & Plan:  Constipation. No problem at this time. GERD: Take the Nexium daily.  Recall for colonoscopy in January. (screening)

## 2016-10-16 NOTE — Patient Instructions (Signed)
Continue the Nexium. OV 1 yr.

## 2017-04-03 ENCOUNTER — Encounter (INDEPENDENT_AMBULATORY_CARE_PROVIDER_SITE_OTHER): Payer: Self-pay | Admitting: *Deleted

## 2017-07-13 ENCOUNTER — Other Ambulatory Visit: Payer: Self-pay | Admitting: Family Medicine

## 2017-07-13 DIAGNOSIS — Z139 Encounter for screening, unspecified: Secondary | ICD-10-CM

## 2017-07-16 ENCOUNTER — Other Ambulatory Visit (HOSPITAL_BASED_OUTPATIENT_CLINIC_OR_DEPARTMENT_OTHER): Payer: Self-pay

## 2017-07-16 DIAGNOSIS — R5383 Other fatigue: Secondary | ICD-10-CM

## 2017-07-16 DIAGNOSIS — G4710 Hypersomnia, unspecified: Secondary | ICD-10-CM

## 2017-07-16 DIAGNOSIS — G4733 Obstructive sleep apnea (adult) (pediatric): Secondary | ICD-10-CM

## 2017-07-16 DIAGNOSIS — R0683 Snoring: Secondary | ICD-10-CM

## 2017-07-21 ENCOUNTER — Ambulatory Visit: Payer: PRIVATE HEALTH INSURANCE | Attending: Family Medicine | Admitting: Neurology

## 2017-07-21 DIAGNOSIS — G4710 Hypersomnia, unspecified: Secondary | ICD-10-CM

## 2017-07-21 DIAGNOSIS — R400 Somnolence: Secondary | ICD-10-CM | POA: Diagnosis present

## 2017-07-21 DIAGNOSIS — R5383 Other fatigue: Secondary | ICD-10-CM | POA: Diagnosis present

## 2017-07-21 DIAGNOSIS — R0683 Snoring: Secondary | ICD-10-CM

## 2017-07-21 DIAGNOSIS — G4733 Obstructive sleep apnea (adult) (pediatric): Secondary | ICD-10-CM | POA: Diagnosis present

## 2017-07-26 NOTE — Procedures (Signed)
[place] A. Merlene Laughter, MD     [email or web address]             NOCTURNAL POLYSOMNOGRAPHY   LOCATION: [facility]   Patient Name: Tanya Galloway, [place] Date: 07/21/2017 Gender: Female D.O.B: [birth year] Age (years): 44 Referring Provider: Caryl Bis Height (inches): 21 Interpreting Physician: Phillips Odor MD, ABSM Weight (lbs): 319 RPSGT: Rosebud Poles BMI: 48 MRN: [id number] Neck Size: 16.50 CLINICAL INFORMATION Sleep Study Type: Split Night CPAP  Indication for sleep study: Excessive Daytime Sleepiness, Fatigue, OSA, Snoring  Epworth Sleepiness Score: 8  SLEEP STUDY TECHNIQUE As per the AASM Manual for the Scoring of Sleep and Associated Events v2.3 (April 2016) with a hypopnea requiring 4% desaturations.  The channels recorded and monitored were frontal, central and occipital EEG, electrooculogram (EOG), submentalis EMG (chin), nasal and oral airflow, thoracic and abdominal wall motion, anterior tibialis EMG, snore microphone, electrocardiogram, and pulse oximetry. Continuous positive airway pressure (CPAP) was initiated when the patient met split night criteria and was titrated according to treat sleep-disordered breathing.  MEDICATIONS Medications self-administered by patient taken the night of the study : N/A  Current Outpatient Medications:  .  dicyclomine (BENTYL) 10 MG capsule, Take 1 capsule (10 mg total) by mouth 2 (two) times daily before a meal. (Patient not taking: Reported on 10/16/2016), Disp: 180 capsule, Rfl: 3 .  esomeprazole (NEXIUM) 40 MG capsule, Take 1 capsule (40 mg total) by mouth daily before breakfast., Disp: 30 capsule, Rfl: 11 .  lisinopril-hydrochlorothiazide (PRINZIDE,ZESTORETIC) 20-12.5 MG per tablet, Take 1 tablet by mouth daily., Disp: , Rfl:  .  naproxen sodium (ANAPROX) 220 MG tablet, Take 220 mg by mouth as needed., Disp: , Rfl:  .  phenylephrine (SUDAFED PE) 10 MG TABS, Take 5 mg by mouth every 6 (six) hours as  needed. , Disp: , Rfl:  .  traMADol (ULTRAM) 50 MG tablet, Take 50 mg by mouth as needed. For back pain, Disp: , Rfl:    RESPIRATORY PARAMETERS Diagnostic  Total AHI (/hr): 57.9 RDI (/hr): 57.9 OA Index (/hr): - CA Index (/hr): 0.0 REM AHI (/hr): N/A NREM AHI (/hr): 57.9 Supine AHI (/hr): 87.7 Non-supine AHI (/hr): 54.17 Min O2 Sat (%): 81.0 Mean O2 (%): 90.1 Time below 88% (min): 40.7   Titration  Optimal Pressure (cm): 7 AHI at Optimal Pressure (/hr): 0.0 Min O2 at Optimal Pressure (%): 89.0 Supine % at Optimal (%): 5 Sleep % at Optimal (%): 96   SLEEP ARCHITECTURE The recording time for the entire night was 471.5 minutes.  During a baseline period of 159.4 minutes, the patient slept for 116.0 minutes in REM and nonREM, yielding a sleep efficiency of 72.8%%. Sleep onset after lights out was 13.7 minutes with a REM latency of N/A minutes. The patient spent 6.5%% of the night in stage N1 sleep, 62.9%% in stage N2 sleep, 30.6%% in stage N3 and 0.0%% in REM.  During the titration period of 307.4 minutes, the patient slept for 181.2 minutes in REM and nonREM, yielding a sleep efficiency of 59.0%%. Sleep onset after CPAP initiation was 37.1 minutes with a REM latency of 128.5 minutes. The patient spent 16.6%% of the night in stage N1 sleep, 39.2%% in stage N2 sleep, 22.6%% in stage N3 and 21.6%% in REM.  CARDIAC DATA The 2 lead EKG demonstrated sinus rhythm. The mean heart rate was 100.0 beats per minute. Other EKG findings include: None. LEG MOVEMENT DATA The total Periodic Limb Movements of Sleep (PLMS) were 0. The  PLMS index was 0.0.  IMPRESSIONS Severe obstructive sleep apnea occurred during the diagnostic portion of the study (AHI = 57.9/hour). The optimal CPAP selected for this patient is ( 7 cm of water)  Delano Metz, MD Diplomate, American Board of Sleep Medicine.  ELECTRONICALLY SIGNED ON:  07/26/2017, 11:07 PM [place] PH: [phone]   FX:  [phone] [place]

## 2017-08-05 ENCOUNTER — Ambulatory Visit
Admission: RE | Admit: 2017-08-05 | Discharge: 2017-08-05 | Disposition: A | Discharge status: Home / Self Care | Payer: PRIVATE HEALTH INSURANCE | Source: Ambulatory Visit | Attending: Family Medicine | Admitting: Family Medicine

## 2017-08-05 DIAGNOSIS — Z139 Encounter for screening, unspecified: Secondary | ICD-10-CM

## 2017-11-04 ENCOUNTER — Encounter (INDEPENDENT_AMBULATORY_CARE_PROVIDER_SITE_OTHER): Payer: Self-pay | Admitting: *Deleted

## 2017-12-17 ENCOUNTER — Encounter (INDEPENDENT_AMBULATORY_CARE_PROVIDER_SITE_OTHER): Payer: Self-pay | Admitting: *Deleted

## 2018-01-05 ENCOUNTER — Other Ambulatory Visit (INDEPENDENT_AMBULATORY_CARE_PROVIDER_SITE_OTHER): Payer: Self-pay | Admitting: *Deleted

## 2018-01-05 DIAGNOSIS — Z1211 Encounter for screening for malignant neoplasm of colon: Secondary | ICD-10-CM | POA: Insufficient documentation

## 2018-02-08 ENCOUNTER — Telehealth (INDEPENDENT_AMBULATORY_CARE_PROVIDER_SITE_OTHER): Payer: Self-pay | Admitting: *Deleted

## 2018-02-08 ENCOUNTER — Encounter (INDEPENDENT_AMBULATORY_CARE_PROVIDER_SITE_OTHER): Payer: Self-pay | Admitting: *Deleted

## 2018-02-08 MED ORDER — SUPREP BOWEL PREP KIT 17.5-3.13-1.6 GM/177ML PO SOLN: 1.0000 | Freq: Once | ORAL | 0 refills | Status: AC

## 2018-02-08 NOTE — Telephone Encounter (Signed)
Patient needs suprep 

## 2018-02-09 ENCOUNTER — Telehealth (INDEPENDENT_AMBULATORY_CARE_PROVIDER_SITE_OTHER): Payer: Self-pay | Admitting: *Deleted

## 2018-02-09 NOTE — Telephone Encounter (Signed)
Referring MD/PCP: daniel   Procedure: tcs  Reason/Indication:  screening  Has patient had this procedure before?  no  If so, when, by whom and where?    Is there a family history of colon cancer?  no  Who?  What age when diagnosed?    Is patient diabetic?   yes      Does patient have prosthetic heart valve or mechanical valve?  no  Do you have a pacemaker?  no  Has patient ever had endocarditis? no  Has patient had joint replacement within last 12 months?  no  Is patient constipated or do they take laxatives? no  Does patient have a history of alcohol/drug use?  no  Is patient on blood thinner such as Coumadin, Plavix and/or Aspirin? no  Medications: lisinopril daily, nexium daily, tylenol sinus daily, dicyclomine daily, metformin 500 mg bid  Allergies: sulfur  Medication Adjustment per Dr Lindi Adie, NP: hold diabetic meds evening before & morning of  Procedure date & time: 03/11/18 at 930

## 2018-02-10 NOTE — Telephone Encounter (Signed)
agree

## 2018-03-11 ENCOUNTER — Other Ambulatory Visit: Payer: Self-pay

## 2018-03-11 ENCOUNTER — Encounter (HOSPITAL_COMMUNITY)
Admission: RE | Disposition: A | Discharge status: Home / Self Care | Payer: Self-pay | Source: Ambulatory Visit | Attending: Internal Medicine

## 2018-03-11 ENCOUNTER — Encounter (HOSPITAL_COMMUNITY): Payer: Self-pay | Admitting: *Deleted

## 2018-03-11 ENCOUNTER — Ambulatory Visit (HOSPITAL_COMMUNITY)
Admission: RE | Admit: 2018-03-11 | Discharge: 2018-03-11 | Disposition: A | Discharge status: Home / Self Care | Payer: PRIVATE HEALTH INSURANCE | Source: Ambulatory Visit | Attending: Internal Medicine | Admitting: Internal Medicine

## 2018-03-11 DIAGNOSIS — Z1211 Encounter for screening for malignant neoplasm of colon: Secondary | ICD-10-CM

## 2018-03-11 DIAGNOSIS — K635 Polyp of colon: Secondary | ICD-10-CM | POA: Insufficient documentation

## 2018-03-11 DIAGNOSIS — E119 Type 2 diabetes mellitus without complications: Secondary | ICD-10-CM | POA: Diagnosis not present

## 2018-03-11 DIAGNOSIS — Z79899 Other long term (current) drug therapy: Secondary | ICD-10-CM | POA: Diagnosis not present

## 2018-03-11 DIAGNOSIS — K5730 Diverticulosis of large intestine without perforation or abscess without bleeding: Secondary | ICD-10-CM | POA: Diagnosis not present

## 2018-03-11 DIAGNOSIS — I10 Essential (primary) hypertension: Secondary | ICD-10-CM | POA: Diagnosis not present

## 2018-03-11 DIAGNOSIS — Z87891 Personal history of nicotine dependence: Secondary | ICD-10-CM | POA: Diagnosis not present

## 2018-03-11 DIAGNOSIS — D126 Benign neoplasm of colon, unspecified: Secondary | ICD-10-CM | POA: Diagnosis not present

## 2018-03-11 DIAGNOSIS — K219 Gastro-esophageal reflux disease without esophagitis: Secondary | ICD-10-CM | POA: Insufficient documentation

## 2018-03-11 DIAGNOSIS — D124 Benign neoplasm of descending colon: Secondary | ICD-10-CM

## 2018-03-11 DIAGNOSIS — Z7984 Long term (current) use of oral hypoglycemic drugs: Secondary | ICD-10-CM | POA: Diagnosis not present

## 2018-03-11 DIAGNOSIS — D123 Benign neoplasm of transverse colon: Secondary | ICD-10-CM

## 2018-03-11 DIAGNOSIS — K589 Irritable bowel syndrome without diarrhea: Secondary | ICD-10-CM | POA: Diagnosis not present

## 2018-03-11 DIAGNOSIS — D125 Benign neoplasm of sigmoid colon: Secondary | ICD-10-CM

## 2018-03-11 HISTORY — PX: COLONOSCOPY: SHX5424

## 2018-03-11 HISTORY — PX: POLYPECTOMY: SHX5525

## 2018-03-11 HISTORY — DX: Type 2 diabetes mellitus without complications: E11.9

## 2018-03-11 LAB — GLUCOSE, CAPILLARY: Glucose-Capillary: 140 mg/dL — ABNORMAL HIGH (ref 70–99)

## 2018-03-11 SURGERY — COLONOSCOPY
Anesthesia: Moderate Sedation

## 2018-03-11 MED ORDER — MIDAZOLAM HCL 5 MG/5ML IJ SOLN
INTRAMUSCULAR | Status: AC
  Filled 2018-03-11: qty 10

## 2018-03-11 MED ORDER — MEPERIDINE HCL 50 MG/ML IJ SOLN
INTRAMUSCULAR | Status: DC | PRN
  Administered 2018-03-11 (×2): 25 mg via INTRAVENOUS

## 2018-03-11 MED ORDER — STERILE WATER FOR IRRIGATION IR SOLN
Status: DC | PRN
  Administered 2018-03-11: 1.5 mL

## 2018-03-11 MED ORDER — SODIUM CHLORIDE 0.9 % IV SOLN
INTRAVENOUS | Status: DC
  Administered 2018-03-11: 09:00:00 via INTRAVENOUS

## 2018-03-11 MED ORDER — MEPERIDINE HCL 50 MG/ML IJ SOLN
INTRAMUSCULAR | Status: AC
  Filled 2018-03-11: qty 1

## 2018-03-11 MED ORDER — MIDAZOLAM HCL 5 MG/5ML IJ SOLN
INTRAMUSCULAR | Status: DC | PRN
  Administered 2018-03-11: 1 mg via INTRAVENOUS
  Administered 2018-03-11: 2 mg via INTRAVENOUS
  Administered 2018-03-11: 1 mg via INTRAVENOUS
  Administered 2018-03-11 (×2): 2 mg via INTRAVENOUS

## 2018-03-11 NOTE — Op Note (Signed)
[facility] Patient Name: Tanya Galloway Procedure Date: 03/11/2018 9:18 AM MRN: [id number] Date of Birth: Aug 29, 1965 Attending MD: Hildred Laser , MD CSN: [id number] Age: 52 Admit Type: Outpatient Procedure:                Colonoscopy Indications:              Screening for colorectal malignant neoplasm Providers:                Hildred Laser, MD, Charlsie Quest. Theda Sers RN, RN, Nelma Rothman, Technician Referring MD:             Gar Ponto, MD Medicines:                Meperidine 50 mg IV, Midazolam 8 mg IV Complications:            No immediate complications. Estimated Blood Loss:     Estimated blood loss was minimal. Procedure:                Pre-Anesthesia Assessment:                           - Prior to the procedure, a History and Physical                            was performed, and patient medications and                            allergies were reviewed. The patient's tolerance of                            previous anesthesia was also reviewed. The risks                            and benefits of the procedure and the sedation                            options and risks were discussed with the patient.                            All questions were answered, and informed consent                            was obtained. Prior Anticoagulants: The patient has                            taken no previous anticoagulant or antiplatelet                            agents. ASA Grade Assessment: II - A patient with                            mild systemic disease. After reviewing the risks  and benefits, the patient was deemed in                            satisfactory condition to undergo the procedure.                           After obtaining informed consent, the colonoscope                            was passed under direct vision. Throughout the                            procedure, the patient's blood pressure, pulse, and                     oxygen saturations were monitored continuously. The                            [id number]-[id number] ([id number]) scope was introduced through                            the anus and advanced to the the cecum, identified                            by appendiceal orifice and ileocecal valve. The                            colonoscopy was somewhat difficult due to a                            redundant colon. The patient tolerated the                            procedure well. The quality of the bowel                            preparation was good. Scope In: 9:36:12 AM Scope Out: 10:09:28 AM Scope Withdrawal Time: 0 hours 14 minutes 59 seconds  Total Procedure Duration: 0 hours 33 minutes 16 seconds  Findings:      The perianal and digital rectal examinations were normal.      Seven polyps were found in the sigmoid colon, descending colon, splenic       flexure, transverse colon and hepatic flexure. The polyps were 4 to 6 mm       in size. These polyps were removed with a cold snare. Resection and       retrieval were complete. The pathology specimen was placed into Bottle       Number 1.      A few diverticula were found in the sigmoid colon.      The retroflexed view of the distal rectum and anal verge was normal and       showed no anal or rectal abnormalities. Impression:               - Seven 4 to 6 mm polyps in the sigmoid colon, in  the descending colon, at the splenic flexure, in                            the transverse colon and at the hepatic flexure,                            removed with a cold snare. Resected and retrieved.                           - Diverticulosis in the sigmoid colon. Moderate Sedation:      Moderate (conscious) sedation was administered by the endoscopy nurse       and supervised by the endoscopist. The following parameters were       monitored: oxygen saturation, heart rate, blood pressure, CO2       capnography and  response to care. Total physician intraservice time was       39 minutes. Recommendation:           - Patient has a contact number available for                            emergencies. The signs and symptoms of potential                            delayed complications were discussed with the                            patient. Return to normal activities tomorrow.                            Written discharge instructions were provided to the                            patient.                           - High fiber diet and diabetic (ADA) diet today.                           - Continue present medications.                           - No aspirin, ibuprofen, naproxen, or other                            non-steroidal anti-inflammatory drugs for 2 days.                           - Await pathology results.                           - Repeat colonoscopy is recommended. The                            colonoscopy date will be determined after pathology  results from today's exam become available for                            review. Procedure Code(s):        --- Professional ---                           [phone], Colonoscopy, flexible; with removal of                            tumor(s), polyp(s), or other lesion(s) by snare                            technique                           99153, Moderate sedation; each additional 15                            minutes intraservice time                           99153, Moderate sedation; each additional 15                            minutes intraservice time                           G0500, Moderate sedation services provided by the                            same physician or other qualified health care                            professional performing a gastrointestinal                            endoscopic service that sedation supports,                            requiring the presence of an independent trained                             observer to assist in the monitoring of the                            patient's level of consciousness and physiological                            status; initial 15 minutes of intra-service time;                            patient age 58 years or older (additional time may                            be reported with [phone], as  appropriate) Diagnosis Code(s):        --- Professional ---                           Z12.11, Encounter for screening for malignant                            neoplasm of colon                           D12.5, Benign neoplasm of sigmoid colon                           D12.4, Benign neoplasm of descending colon                           D12.3, Benign neoplasm of transverse colon (hepatic                            flexure or splenic flexure)                           K57.30, Diverticulosis of large intestine without                            perforation or abscess without bleeding CPT copyright 2018 American Medical Association. All rights reserved. The codes documented in this report are preliminary and upon coder review may  be revised to meet current compliance requirements. Hildred Laser, MD Hildred Laser, MD 03/11/2018 10:17:10 AM This report has been signed electronically. Number of Addenda: 0

## 2018-03-11 NOTE — Discharge Instructions (Addendum)
Diverticulosis Diverticulosis is a condition that develops when small pouches (diverticula) form in the wall of the large intestine (colon). The colon is where water is absorbed and stool is formed. The pouches form when the inside layer of the colon pushes through weak spots in the outer layers of the colon. You may have a few pouches or many of them. What are the causes? The cause of this condition is not known. What increases the risk? The following factors may make you more likely to develop this condition:  Being older than age 27. Your risk for this condition increases with age. Diverticulosis is rare among people younger than age 36. By age 28, many people have it.  Eating a low-fiber diet.  Having frequent constipation.  Being overweight.  Not getting enough exercise.  Smoking.  Taking over-the-counter pain medicines, like aspirin and ibuprofen.  Having a family history of diverticulosis.  What are the signs or symptoms? In most people, there are no symptoms of this condition. If you do have symptoms, they may include:  Bloating.  Cramps in the abdomen.  Constipation or diarrhea.  Pain in the lower left side of the abdomen.  How is this diagnosed? This condition is most often diagnosed during an exam for other colon problems. Because diverticulosis usually has no symptoms, it often cannot be diagnosed independently. This condition may be diagnosed by:  Using a flexible scope to examine the colon (colonoscopy).  Taking an X-ray of the colon after dye has been put into the colon (barium enema).  Doing a CT scan.  How is this treated? You may not need treatment for this condition if you have never developed an infection related to diverticulosis. If you have had an infection before, treatment may include:  Eating a high-fiber diet. This may include eating more fruits, vegetables, and grains.  Taking a fiber supplement.  Taking a live bacteria supplement  (probiotic).  Taking medicine to relax your colon.  Taking antibiotic medicines.  Follow these instructions at home:  Drink 6-8 glasses of water or more each day to prevent constipation.  Try not to strain when you have a bowel movement.  If you have had an infection before: ? Eat more fiber as directed by your health care provider or your diet and nutrition specialist (dietitian). ? Take a fiber supplement or probiotic, if your health care provider approves.  Take over-the-counter and prescription medicines only as told by your health care provider.  If you were prescribed an antibiotic, take it as told by your health care provider. Do not stop taking the antibiotic even if you start to feel better.  Keep all follow-up visits as told by your health care provider. This is important. Contact a health care provider if:  You have pain in your abdomen.  You have bloating.  You have cramps.  You have not had a bowel movement in 3 days. Get help right away if:  Your pain gets worse.  Your bloating becomes very bad.  You have a fever or chills, and your symptoms suddenly get worse.  You vomit.  You have bowel movements that are bloody or black.  You have bleeding from your rectum. Summary  Diverticulosis is a condition that develops when small pouches (diverticula) form in the wall of the large intestine (colon).  You may have a few pouches or many of them.  This condition is most often diagnosed during an exam for other colon problems.  If you have had an  infection related to diverticulosis, treatment may include increasing the fiber in your diet, taking supplements, or taking medicines. This information is not intended to replace advice given to you by your health care provider. Make sure you discuss any questions you have with your health care provider. Document Released: 12/20/2003 Document Revised: 02/11/2016 Document Reviewed: 02/11/2016 Elsevier Interactive  Patient Education  2017 [employer].   Colonoscopy, Adult, Care After This sheet gives you information about how to care for yourself after your procedure. Your doctor may also give you more specific instructions. If you have problems or questions, call your doctor. Follow these instructions at home: General instructions   For the first 24 hours after the procedure: ? Do not drive or use machinery. ? Do not sign important documents. ? Do not drink alcohol. ? Do your daily activities more slowly than normal. ? Eat foods that are soft and easy to digest. ? Rest often.  Take over-the-counter or prescription medicines only as told by your doctor.  It is up to you to get the results of your procedure. Ask your doctor, or the department performing the procedure, when your results will be ready. To help cramping and bloating:  Try walking around.  Put heat on your belly (abdomen) as told by your doctor. Use a heat source that your doctor recommends, such as a moist heat pack or a heating pad. ? Put a towel between your skin and the heat source. ? Leave the heat on for 20-30 minutes. ? Remove the heat if your skin turns bright red. This is especially important if you cannot feel pain, heat, or cold. You can get burned. Eating and drinking  Drink enough fluid to keep your pee (urine) clear or pale yellow.  Return to your normal diet as told by your doctor. Avoid heavy or fried foods that are hard to digest.  Avoid drinking alcohol for as long as told by your doctor. Contact a doctor if:  You have blood in your poop (stool) 2-3 days after the procedure. Get help right away if:  You have more than a small amount of blood in your poop.  You see large clumps of tissue (blood clots) in your poop.  Your belly is swollen.  You feel sick to your stomach (nauseous).  You throw up (vomit).  You have a fever.  You have belly pain that gets worse, and medicine does not help your  pain. This information is not intended to replace advice given to you by your health care provider. Make sure you discuss any questions you have with your health care provider. Document Released: 04/26/2010 Document Revised: 12/17/2015 Document Reviewed: 12/17/2015 Elsevier Interactive Patient Education  2017 [employer].   Colon Polyps Polyps are tissue growths inside the body. Polyps can grow in many places, including the large intestine (colon). A polyp may be a round bump or a mushroom-shaped growth. You could have one polyp or several. Most colon polyps are noncancerous (benign). However, some colon polyps can become cancerous over time. What are the causes? The exact cause of colon polyps is not known. What increases the risk? This condition is more likely to develop in people who:  Have a family history of colon cancer or colon polyps.  Are older than 34 or older than 45 if they are African American.  Have inflammatory bowel disease, such as ulcerative colitis or Crohn disease.  Are overweight.  Smoke cigarettes.  Do not get enough exercise.  Drink too much  alcohol.  Eat a diet that is: ? High in fat and red meat. ? Low in fiber.  Had childhood cancer that was treated with abdominal radiation.  What are the signs or symptoms? Most polyps do not cause symptoms. If you have symptoms, they may include:  Blood coming from your rectum when having a bowel movement.  Blood in your stool.The stool may look dark red or black.  A change in bowel habits, such as constipation or diarrhea.  How is this diagnosed? This condition is diagnosed with a colonoscopy. This is a procedure that uses a lighted, flexible scope to look at the inside of your colon. How is this treated? Treatment for this condition involves removing any polyps that are found. Those polyps will then be tested for cancer. If cancer is found, your health care provider will talk to you about options for colon  cancer treatment. Follow these instructions at home: Diet  Eat plenty of fiber, such as fruits, vegetables, and whole grains.  Eat foods that are high in calcium and vitamin D, such as milk, cheese, yogurt, eggs, liver, fish, and broccoli.  Limit foods high in fat, red meats, and processed meats, such as hot dogs, sausage, bacon, and lunch meats.  Maintain a healthy weight, or lose weight if recommended by your health care provider. General instructions  Do not smoke cigarettes.  Do not drink alcohol excessively.  Keep all follow-up visits as told by your health care provider. This is important. This includes keeping regularly scheduled colonoscopies. Talk to your health care provider about when you need a colonoscopy.  Exercise every day or as told by your health care provider. Contact a health care provider if:  You have new or worsening bleeding during a bowel movement.  You have new or increased blood in your stool.  You have a change in bowel habits.  You unexpectedly lose weight. This information is not intended to replace advice given to you by your health care provider. Make sure you discuss any questions you have with your health care provider. Document Released: 12/19/2003 Document Revised: 08/30/2015 Document Reviewed: 02/12/2015 Elsevier Interactive Patient Education  2018 [employer]. No aspirin or NSAIDs for 2 days. Resume other medications as before. High-fiber diet. No driving for 24 hours. Physician will call with biopsy results.

## 2018-03-11 NOTE — H&P (Signed)
Tanya Galloway is an 52 y.o. female.   Chief Complaint: Patient is here for colonoscopy. HPI: Patient is a 52 year old Caucasian female who is here for screening colonoscopy.  This is patient's first exam.  She she denies abdominal pain change in bowel habits or rectal bleeding. Patient is not sure how she was diagnosed with colonic diverticulosis. Family history is negative for CRC. Last Excedrin migraine dose was 1 month ago.  Past Medical History:  Diagnosis Date  . Diabetes mellitus without complication ([place])   . Diverticulosis   . GERD (gastroesophageal reflux disease)   . H/O seasonal allergies   . Hemorrhoids   . Hypertension   . Irritable bowel syndrome     Past Surgical History:  Procedure Laterality Date  . KNEE ARTHROSCOPY  2011   left knee    Family History  Problem Relation Age of Onset  . Diabetes Mother   . Thyroid disease Mother   . Hypertension Mother   . Hypertension Father   . Hypertension Brother   . Healthy Brother   . Breast cancer Maternal Grandmother   . Colon cancer Neg Hx    Social History:  reports that she quit smoking about 8 years ago. Her smoking use included cigarettes. She has never used smokeless tobacco. She reports that she drinks alcohol. She reports that she does not use drugs.  Allergies:  Allergies  Allergen Reactions  . Sulfa Antibiotics Nausea Only and Swelling    Medications Prior to Admission  Medication Sig Dispense Refill  . aspirin-acetaminophen-caffeine (EXCEDRIN MIGRAINE) 250-250-65 MG tablet Take 1 tablet by mouth every 6 (six) hours as needed for headache.    . dicyclomine (BENTYL) 10 MG capsule Take 1 capsule (10 mg total) by mouth 2 (two) times daily before a meal. 180 capsule 3  . esomeprazole (NEXIUM) 20 MG capsule Take 20 mg by mouth daily before breakfast.    . lisinopril-hydrochlorothiazide (PRINZIDE,ZESTORETIC) 20-12.5 MG per tablet Take 1 tablet by mouth daily.    . metFORMIN (GLUCOPHAGE-XR) 500 MG 24 hr tablet  Take 500-1,000 mg by mouth See admin instructions. Take 1 tablet (500 mg) in the morning & 2 tablets (1000 mg) by mouth in the evening.  2  . pseudoephedrine-acetaminophen (TYLENOL SINUS) 30-500 MG TABS tablet Take 1-2 tablets by mouth every 4 (four) hours as needed (sinus/allergy issues.).    Marland Kitchen traMADol (ULTRAM) 50 MG tablet Take 50 mg by mouth daily as needed (pain.). For back pain       Results for orders placed or performed during the hospital encounter of 03/11/18 (from the past 48 hour(s))  Glucose, capillary     Status: Abnormal   Collection Time: 03/11/18  8:45 AM  Result Value Ref Range   Glucose-Capillary 140 (H) 70 - 99 mg/dL   No results found.  ROS  Blood pressure 109/65, pulse 95, temperature 97.8 F (36.6 C), temperature source Oral, resp. rate 16, height 5\' 9"  (1.753 m), weight (!) 142 kg, SpO2 100 %. Physical Exam  Constitutional:  Obese Caucasian female in NAD.  HENT:  Mouth/Throat: Oropharynx is clear and moist.  Eyes: Conjunctivae are normal. No scleral icterus.  Neck: No thyromegaly present.  Cardiovascular: Normal rate, regular rhythm and normal heart sounds.  No murmur heard. Respiratory: Effort normal and breath sounds normal.  GI:  Abdomen is full but soft and nontender with organomegaly or masses.  Musculoskeletal: She exhibits no edema.  Lymphadenopathy:    She has no cervical adenopathy.  Neurological: She  is alert.  Skin: Skin is warm and dry.     Assessment/Plan Average risk screening colonoscopy.  Hildred Laser, MD 03/11/2018, 9:26 AM

## 2018-03-17 ENCOUNTER — Encounter (HOSPITAL_COMMUNITY): Payer: Self-pay | Admitting: Internal Medicine

## 2018-08-25 ENCOUNTER — Other Ambulatory Visit: Payer: Self-pay | Admitting: Family Medicine

## 2018-08-25 DIAGNOSIS — Z1231 Encounter for screening mammogram for malignant neoplasm of breast: Secondary | ICD-10-CM

## 2018-09-09 ENCOUNTER — Other Ambulatory Visit: Payer: Self-pay | Admitting: Family Medicine

## 2018-09-09 DIAGNOSIS — N644 Mastodynia: Secondary | ICD-10-CM

## 2018-09-14 ENCOUNTER — Other Ambulatory Visit: Payer: Self-pay

## 2018-09-14 ENCOUNTER — Ambulatory Visit: Payer: PRIVATE HEALTH INSURANCE

## 2018-09-14 ENCOUNTER — Ambulatory Visit
Admission: RE | Admit: 2018-09-14 | Discharge: 2018-09-14 | Disposition: A | Discharge status: Home / Self Care | Payer: PRIVATE HEALTH INSURANCE | Source: Ambulatory Visit | Attending: Family Medicine | Admitting: Family Medicine

## 2018-09-14 DIAGNOSIS — N644 Mastodynia: Secondary | ICD-10-CM

## 2018-11-05 ENCOUNTER — Other Ambulatory Visit: Payer: Self-pay

## 2018-11-05 ENCOUNTER — Other Ambulatory Visit: Payer: PRIVATE HEALTH INSURANCE

## 2018-11-05 DIAGNOSIS — Z20822 Contact with and (suspected) exposure to covid-19: Secondary | ICD-10-CM

## 2018-11-07 LAB — NOVEL CORONAVIRUS, NAA: SARS-CoV-2, NAA: NOT DETECTED

## 2018-11-10 ENCOUNTER — Emergency Department (HOSPITAL_COMMUNITY): Payer: PRIVATE HEALTH INSURANCE

## 2018-11-10 ENCOUNTER — Emergency Department (HOSPITAL_COMMUNITY)
Admission: EM | Admit: 2018-11-10 | Discharge: 2018-11-10 | Disposition: A | Discharge status: Home / Self Care | Payer: PRIVATE HEALTH INSURANCE | Attending: Emergency Medicine | Admitting: Emergency Medicine

## 2018-11-10 ENCOUNTER — Encounter (HOSPITAL_COMMUNITY): Payer: Self-pay | Admitting: Emergency Medicine

## 2018-11-10 ENCOUNTER — Other Ambulatory Visit: Payer: Self-pay

## 2018-11-10 DIAGNOSIS — J439 Emphysema, unspecified: Secondary | ICD-10-CM | POA: Diagnosis not present

## 2018-11-10 DIAGNOSIS — R918 Other nonspecific abnormal finding of lung field: Secondary | ICD-10-CM

## 2018-11-10 DIAGNOSIS — Z7984 Long term (current) use of oral hypoglycemic drugs: Secondary | ICD-10-CM | POA: Diagnosis not present

## 2018-11-10 DIAGNOSIS — N644 Mastodynia: Secondary | ICD-10-CM | POA: Diagnosis present

## 2018-11-10 DIAGNOSIS — I10 Essential (primary) hypertension: Secondary | ICD-10-CM | POA: Diagnosis not present

## 2018-11-10 DIAGNOSIS — E119 Type 2 diabetes mellitus without complications: Secondary | ICD-10-CM | POA: Diagnosis not present

## 2018-11-10 DIAGNOSIS — R911 Solitary pulmonary nodule: Secondary | ICD-10-CM | POA: Insufficient documentation

## 2018-11-10 DIAGNOSIS — Z79899 Other long term (current) drug therapy: Secondary | ICD-10-CM | POA: Diagnosis not present

## 2018-11-10 DIAGNOSIS — R0600 Dyspnea, unspecified: Secondary | ICD-10-CM | POA: Diagnosis not present

## 2018-11-10 DIAGNOSIS — Z87891 Personal history of nicotine dependence: Secondary | ICD-10-CM | POA: Insufficient documentation

## 2018-11-10 LAB — BASIC METABOLIC PANEL
Anion gap: 11 (ref 5–15)
BUN: 13 mg/dL (ref 6–20)
CO2: 28 mmol/L (ref 22–32)
Calcium: 9.1 mg/dL (ref 8.9–10.3)
Chloride: 100 mmol/L (ref 98–111)
Creatinine, Ser: 0.8 mg/dL (ref 0.44–1.00)
GFR calc Af Amer: >60 mL/min (ref >60)
GFR calc non Af Amer: >60 mL/min (ref >60)
Glucose, Bld: 146 mg/dL — ABNORMAL HIGH (ref 70–99)
Potassium: 4.6 mmol/L (ref 3.5–5.1)
Sodium: 139 mmol/L (ref 135–145)

## 2018-11-10 LAB — D-DIMER, QUANTITATIVE: D-Dimer, Quant: <0.27 ug/mL-FEU (ref 0.00–0.50)

## 2018-11-10 LAB — TROPONIN I (HIGH SENSITIVITY): Troponin I (High Sensitivity): 3 ng/L (ref <18)

## 2018-11-10 MED ORDER — LORAZEPAM 2 MG/ML IJ SOLN
0.5000 mg | Freq: Once | INTRAMUSCULAR | Status: AC
  Administered 2018-11-10: 0.5 mg via INTRAVENOUS
  Filled 2018-11-10: qty 1

## 2018-11-10 MED ORDER — DIPHENHYDRAMINE HCL 12.5 MG/5ML PO ELIX
12.5000 mg | ORAL_SOLUTION | Freq: Once | ORAL | Status: AC
  Administered 2018-11-10: 12.5 mg via ORAL
  Filled 2018-11-10: qty 5

## 2018-11-10 MED ORDER — IOHEXOL 300 MG/ML  SOLN
75.0000 mL | Freq: Once | INTRAMUSCULAR | Status: AC | PRN
  Administered 2018-11-10: 75 mL via INTRAVENOUS

## 2018-11-10 NOTE — ED Notes (Signed)
Notified Mariea Clonts, CT, that pt has been given anxiety meds prior to CT transport.

## 2018-11-10 NOTE — ED Provider Notes (Signed)
[facility] EMERGENCY DEPARTMENT Provider Note   CSN: [id number] Arrival date & time: 11/10/18  0911     History   Chief Complaint Chief Complaint  Patient presents with   Breast Pain    left    HPI Tanya Galloway is a 53 y.o. female.     Patient is a 53 year old female who presents to the emergency department with a complaint of pain under the left breast.  The patient states this problem started over a week ago.  She was seen by her primary physician, and it was suggested that she have a mammogram done.  The mammogram was done and was found to be negative for any acute changes or acute problems.  The patient continues to have pain under the breast.  She says that today the pain was worse.  And the pain seemed to bother her when she would take a deep breath.  She became concerned and wanted to have this checked father.  She contacted her primary physician and was told to come to the emergency department.  There is been no fever, no chills, no hemoptysis reported.  No known injury or trauma to the chest.  No recent operations or procedures.  The history is provided by the patient.    Past Medical History:  Diagnosis Date   Diabetes mellitus without complication ([place])    Diverticulosis    GERD (gastroesophageal reflux disease)    H/O seasonal allergies    Hemorrhoids    Hypertension    Irritable bowel syndrome     Patient Active Problem List   Diagnosis Date Noted   Special screening for malignant neoplasms, colon 01/05/2018   GERD (gastroesophageal reflux disease) 12/15/2011   HTN (hypertension) 09/16/2011   IBS (irritable bowel syndrome) 09/16/2011   Obesity 09/16/2011    Past Surgical History:  Procedure Laterality Date   COLONOSCOPY N/A 03/11/2018   Procedure: COLONOSCOPY;  Surgeon: Rogene Houston, MD;  Location: [facility] ENDO SU[facility];  Service: Endoscopy;  Laterality: N/A;  930   KNEE ARTHROSCOPY  2011   left knee   POLYPECTOMY  03/11/2018   Procedure:  POLYPECTOMY;  Surgeon: Rogene Houston, MD;  Location: [facility] ENDO SUITE;  Service: Endoscopy;;  splenic flexure (CSx1), transverse colon (CBx1), hepatic flexure (CSx1), descending colon (CSx1)distal sigmoid colon(CSx3)     OB History   No obstetric history on file.      Home Medications    Prior to Admission medications   Medication Sig Start Date End Date Taking? Authorizing Provider  aspirin-acetaminophen-caffeine (EXCEDRIN MIGRAINE) [phone] MG tablet Take 1 tablet by mouth every 6 (six) hours as needed for headache. 03/13/18   Rogene Houston, MD  dicyclomine (BENTYL) 10 MG capsule Take 1 capsule (10 mg total) by mouth 2 (two) times daily before a meal. 07/18/13   Rehman, Mechele Dawley, MD  esomeprazole (NEXIUM) 20 MG capsule Take 20 mg by mouth daily before breakfast.    [provider]  lisinopril-hydrochlorothiazide (PRINZIDE,ZESTORETIC) 20-12.5 MG per tablet Take 1 tablet by mouth daily.    [provider]  metFORMIN (GLUCOPHAGE-XR) 500 MG 24 hr tablet Take 500-1,000 mg by mouth See admin instructions. Take 1 tablet (500 mg) in the morning & 2 tablets (1000 mg) by mouth in the evening. 01/27/18   [provider]  pseudoephedrine-acetaminophen (TYLENOL SINUS) 30-500 MG TABS tablet Take 1-2 tablets by mouth every 4 (four) hours as needed (sinus/allergy issues.).    [provider]  traMADol (ULTRAM) 50 MG  tablet Take 50 mg by mouth daily as needed (pain.). For back pain     [provider]    Family History Family History  Problem Relation Age of Onset   Diabetes Mother    Thyroid disease Mother    Hypertension Mother    Hypertension Father    Hypertension Brother    Healthy Brother    Breast cancer Maternal Grandmother    Colon cancer Neg Hx     Social History Social History   Tobacco Use   Smoking status: Former Smoker    Types: Cigarettes    Quit date: 09/15/2009    Years since quitting: 9.1   Smokeless tobacco: Never  Used   Tobacco comment: Patient smoked for 25 years, 1 1/2 ppd  Substance Use Topics   Alcohol use: Yes    Comment: Twice a Year   Drug use: No     Allergies   Sulfa antibiotics   Review of Systems Review of Systems  Constitutional: Negative for activity change and appetite change.  HENT: Negative for congestion, ear discharge, ear pain, facial swelling, nosebleeds, rhinorrhea, sneezing and tinnitus.   Eyes: Negative for photophobia, pain and discharge.  Respiratory: Negative for cough, choking, chest tightness, shortness of breath, wheezing and stridor.        Pain with deep breathing  Cardiovascular: Negative for chest pain, palpitations and leg swelling.  Gastrointestinal: Negative for abdominal pain, blood in stool, constipation, diarrhea, nausea and vomiting.  Genitourinary: Negative for difficulty urinating, dysuria, flank pain, frequency and hematuria.  Musculoskeletal: Negative for back pain, gait problem, myalgias and neck pain.  Skin: Negative for color change, rash and wound.  Neurological: Negative for dizziness, seizures, syncope, facial asymmetry, speech difficulty, weakness and numbness.  Hematological: Negative for adenopathy. Does not bruise/bleed easily.  Psychiatric/Behavioral: Negative for agitation, confusion, hallucinations, self-injury and suicidal ideas. The patient is not nervous/anxious.      Physical Exam Updated Vital Signs BP (!) 130/97 (BP Location: Right Arm)    Pulse 88    Temp 98.3 F (36.8 C) (Oral)    Resp 16    SpO2 98%   Physical Exam Vitals signs and nursing note reviewed. Exam conducted with a chaperone present.  Constitutional:      Appearance: She is well-developed. She is not toxic-appearing.  HENT:     Head: Normocephalic.     Right Ear: Tympanic membrane and external ear normal.     Left Ear: Tympanic membrane and external ear normal.  Eyes:     General: Lids are normal.     Pupils: Pupils are equal, round, and reactive to  light.  Neck:     Musculoskeletal: Normal range of motion and neck supple.     Vascular: No carotid bruit.  Cardiovascular:     Rate and Rhythm: Normal rate and regular rhythm.     Pulses: Normal pulses.     Heart sounds: Normal heart sounds.  Pulmonary:     Effort: No respiratory distress.     Breath sounds: Normal breath sounds.  Chest:     Chest wall: Tenderness present. No swelling.     Breasts:        Left: Normal. No nipple discharge, skin change or tenderness.    Abdominal:     General: Bowel sounds are normal.     Palpations: Abdomen is soft.     Tenderness: There is no abdominal tenderness. There is no guarding.  Musculoskeletal: Normal range of motion.  Lymphadenopathy:  Head:     Right side of head: No submandibular adenopathy.     Left side of head: No submandibular adenopathy.     Cervical: No cervical adenopathy.     Upper Body:     Left upper body: No axillary adenopathy.  Skin:    General: Skin is warm and dry.  Neurological:     Mental Status: She is alert and oriented to person, place, and time.     Cranial Nerves: No cranial nerve deficit.     Sensory: No sensory deficit.  Psychiatric:        Speech: Speech normal.      ED Treatments / Results  Labs (all labs ordered are listed, but only abnormal results are displayed) Labs Reviewed  BASIC METABOLIC PANEL - Abnormal; Notable for the following components:      Result Value   Glucose, Bld 146 (*)    All other components within normal limits  D-DIMER, QUANTITATIVE (NOT AT [facility])  TROPONIN I (HIGH SENSITIVITY)    EKG None  Radiology Dg Ribs Unilateral W/chest Left  Result Date: 11/10/2018 CLINICAL DATA:  Left breast pain. EXAM: LEFT RIBS AND CHEST - 3+ VIEW COMPARISON:  None. FINDINGS: Heart size is normal. Mediastinal shadows are normal. The pulmonary vascularity is normal. 1 cm density in the left mid lung is worrisome for a mass lesion. Chest CT suggested. Left rib films performed with a  marker in the region of concern do not show any rib or chest wall abnormality. IMPRESSION: No evidence of acute or focal rib finding. 1 cm density in the left midlung that could represent a tumor. Chest CT recommended. Otherwise no active disease. Electronically Signed   By: Nelson Chimes M.D.   On: 11/10/2018 12:03   Ct Chest W Contrast  Result Date: 11/10/2018 CLINICAL DATA:  Left-sided anterior chest pain under the left breast for 2-3 weeks. EXAM: CT CHEST WITH CONTRAST TECHNIQUE: Multidetector CT imaging of the chest was performed during intravenous contrast administration. CONTRAST:  3mL OMNIPAQUE IOHEXOL 300 MG/ML  SOLN COMPARISON:  None. FINDINGS: Cardiovascular: Normal heart size.  No pericardial effusion. Mediastinum/Nodes: Normal appearance of the thyroid gland. The trachea appears patent and is midline. Normal appearance of the esophagus. No mediastinal or hilar adenopathy. Lungs/Pleura: No pleural effusion. Mild changes of emphysema. No airspace consolidation or atelectasis. Nodule within the lingula measures 2 cm, image 138/6, and has peripheral spiculated margins concerning for underlying pulmonary neoplasm. The lesion touches both the lateral pleura and oblique fissure, image 49/3. Tiny lateral right lower lobe lung nodule measures 3 mm, image 71/4. Upper Abdomen: The liver appears enlarged with hypertrophy of both the lateral segment of left lobe of liver and caudate lobe of liver. There is diffuse hepatic steatosis noted. No acute findings identified within the abdomen. Musculoskeletal: No chest wall abnormality. No acute or significant osseous findings. IMPRESSION: 1. Lingular nodule with spiculated margins measures 2 cm and is suspicious for primary bronchogenic carcinoma. Further evaluation with PET-CT is recommended. 2.  Emphysema (ICD10-J43.9). 3. Hepatic steatosis with morphologic features of liver suggestive of early cirrhosis. Electronically Signed   By: Kerby Moors M.D.   On:  11/10/2018 14:48    Procedures Procedures (including critical care time)  Medications Ordered in ED Medications  LORazepam (ATIVAN) injection 0.5 mg (0.5 mg Intravenous Given 11/10/18 1332)  diphenhydrAMINE (BENADRYL) 12.5 MG/5ML elixir 12.5 mg (12.5 mg Oral Given 11/10/18 1332)  iohexol (OMNIPAQUE) 300 MG/ML solution 75 mL (75 mLs Intravenous Contrast Given  11/10/18 1353)     Initial Impression / Assessment and Plan / ED Course  I have reviewed the triage vital signs and the nursing notes.  Pertinent labs & imaging results that were available during my care of the patient were reviewed by me and considered in my medical decision making (see chart for details).          Final Clinical Impressions(s) / ED Diagnoses MDM  Vital signs reviewed.  Pulse oximetry is 98% on room air.  Within normal limits by my interpretation.  Patient has mild discomfort at the lower portion of the left rib just under the left breast.  Will obtain chest x-ray, EKG, troponin, and d-dimer.  The basic metabolic panel shows the glucose to be slightly elevated at 146, otherwise normal.  The anion gap is normal at 11.  The d-dimer is negative at less than 0.27.  The troponin is negative for acute cardiac event. Chest x-ray shows no evidence of acute rib fracture.  There is a density in the left midlung that was suspected to represent a tumor and CT scan was suggested.  CT of the chest with contrast showed a lingular nodule with spiculated margins measuring 2 cm and was suspicious for primary bronchogenic carcinoma.  A PET scan was suggested.  There was also evidence of some emphysema, and some hepatic steatosis suggestive of possible early cirrhosis.  I discussed the examination and the findings with Dr. Quillian Quince, the primary physician.  I discussed the findings in detail with the patient in terms of which he understands.  I have asked the patient to call Dr. Quillian Quince on tomorrow, August 6 to discuss the findings and  the plans going forward.  The patient is in agreement with this plan.   Final diagnoses:  Mass of left lung  Pulmonary emphysema, unspecified emphysema type [facility])    ED Discharge Orders    None       Lily Kocher, PA-C 11/10/18 [place], [place], DO 11/13/18 [phone]

## 2018-11-10 NOTE — ED Triage Notes (Signed)
C/o left breast pain.  PCP had pt get mammogram but it was negative. Rates pain 5/10.  Can fell pain when she takes a deep breathe.

## 2018-11-10 NOTE — Discharge Instructions (Addendum)
Your vital signs have been reviewed. Your heart enzymes are negative for acute event. Your D dimer test to negative for blood clots. Your chest xray is negative for rib fracture or dislocation. There was question of an abnormality on the Chest xray. CT scan of the chest reveals a 2cm mass in the left lung. Please discuss this with Dr Quillian Quince or a member of his team as soon as possible.

## 2018-11-16 ENCOUNTER — Other Ambulatory Visit: Payer: Self-pay | Admitting: Physician Assistant

## 2018-11-16 ENCOUNTER — Other Ambulatory Visit (HOSPITAL_COMMUNITY): Payer: Self-pay | Admitting: Physician Assistant

## 2018-11-16 DIAGNOSIS — R918 Other nonspecific abnormal finding of lung field: Secondary | ICD-10-CM

## 2018-11-17 ENCOUNTER — Encounter (HOSPITAL_COMMUNITY): Payer: Self-pay

## 2018-11-17 ENCOUNTER — Ambulatory Visit (HOSPITAL_COMMUNITY): Admission: RE | Admit: 2018-11-17 | Payer: PRIVATE HEALTH INSURANCE | Source: Ambulatory Visit

## 2018-11-23 ENCOUNTER — Other Ambulatory Visit: Payer: Self-pay

## 2018-11-23 ENCOUNTER — Ambulatory Visit (HOSPITAL_COMMUNITY)
Admission: RE | Admit: 2018-11-23 | Discharge: 2018-11-23 | Disposition: A | Discharge status: Home / Self Care | Payer: PRIVATE HEALTH INSURANCE | Source: Ambulatory Visit | Attending: Physician Assistant | Admitting: Physician Assistant

## 2018-11-23 DIAGNOSIS — R918 Other nonspecific abnormal finding of lung field: Secondary | ICD-10-CM | POA: Insufficient documentation

## 2018-11-23 LAB — GLUCOSE, CAPILLARY: Glucose-Capillary: 154 mg/dL — ABNORMAL HIGH (ref 70–99)

## 2018-11-23 MED ORDER — FLUDEOXYGLUCOSE F - 18 (FDG) INJECTION
15.0000 | Freq: Once | INTRAVENOUS | Status: AC | PRN
  Administered 2018-11-23: 15 via INTRAVENOUS

## 2018-11-24 ENCOUNTER — Encounter (HOSPITAL_COMMUNITY): Payer: Self-pay | Admitting: Hematology

## 2018-11-24 ENCOUNTER — Inpatient Hospital Stay (HOSPITAL_COMMUNITY): Payer: PRIVATE HEALTH INSURANCE | Attending: Hematology | Admitting: Hematology

## 2018-11-24 DIAGNOSIS — I10 Essential (primary) hypertension: Secondary | ICD-10-CM | POA: Insufficient documentation

## 2018-11-24 DIAGNOSIS — Z87891 Personal history of nicotine dependence: Secondary | ICD-10-CM | POA: Insufficient documentation

## 2018-11-24 DIAGNOSIS — J439 Emphysema, unspecified: Secondary | ICD-10-CM | POA: Insufficient documentation

## 2018-11-24 DIAGNOSIS — Z840 Family history of diseases of the skin and subcutaneous tissue: Secondary | ICD-10-CM

## 2018-11-24 DIAGNOSIS — R911 Solitary pulmonary nodule: Secondary | ICD-10-CM | POA: Diagnosis not present

## 2018-11-24 DIAGNOSIS — Z803 Family history of malignant neoplasm of breast: Secondary | ICD-10-CM | POA: Insufficient documentation

## 2018-11-24 DIAGNOSIS — Z7984 Long term (current) use of oral hypoglycemic drugs: Secondary | ICD-10-CM | POA: Diagnosis not present

## 2018-11-24 DIAGNOSIS — E119 Type 2 diabetes mellitus without complications: Secondary | ICD-10-CM

## 2018-11-24 DIAGNOSIS — Z800 Family history of malignant neoplasm of digestive organs: Secondary | ICD-10-CM | POA: Insufficient documentation

## 2018-11-24 NOTE — Assessment & Plan Note (Signed)
1.  Left upper lobe lung nodule: - Patient reported 43-month history of left chest wall pain. -She was evaluated in the ER on 11/10/2018.  Chest x-ray showed abnormal findings. - Left lingular nodule measures 2 cm and has peripheral spiculated margins concerning for neoplasm.  The lesion touches both the left pleura and oblique fissure.  Tiny right lower lobe lateral lung nodule measuring 3 mm. - PET CT scan on 11/23/2018 shows left upper lobe nodule measuring 1.7 cm with SUV of 4.2.  Single focus of metabolic activity within the mid vertebral body at T12 level.  SUV of 5.6. - She quit smoking 9 years ago, smoked 1 to 2 packs/day for 20 years. - I had a prolonged discussion with the patient and her husband who is on the phone.  I have recommended imaging of the brain to complete work-up. -Because of the suspicious findings on the T12 vertebral body, I have recommended MRI of the thoracic spine with and without contrast.  Patient reportedly is claustrophobic.  We will coordinate this process with radiology by giving some Ativan. -We will also schedule her for pulmonary function testing.  I will also make a referral to Dr. Roxan Hockey in [place].  2.  Health maintenance: -Mammogram on 09/14/2018 was BI-RADS Category 1. -Colonoscopy on 03/11/2018 showed 7 4 to 6 mm polyps in the sigmoid colon, in the descending colon, at the splenic flexure, in the transverse colon and at the hepatic flexure.  Diverticulosis in the sigmoid colon.  Pathology consistent with tubular adenoma, sessile serrated polyp without dysplasia, hyperplastic polyp.

## 2018-11-24 NOTE — Patient Instructions (Signed)
[facility] Discharge Instructions  You were seen today by Dr. Delton Coombes. He went over your history, family history and how you've been feeling lately. He will schedule you for a MRI of your brain as well as a pulmonary function test . He will see you back after your MRI for follow up.   Thank you for choosing [place] [facility] to provide your oncology and hematology care.  To afford each patient quality time with our provider, please arrive at least 15 minutes before your scheduled appointment time.   If you have a lab appointment with the [facility] please come in thru the  Main Entrance and check in at the main information desk  You need to re-schedule your appointment should you arrive 10 or more minutes late.  We strive to give you quality time with our providers, and arriving late affects you and other patients whose appointments are after yours.  Also, if you no show three or more times for appointments you may be dismissed from the clinic at the providers discretion.     Again, thank you for choosing [facility].  Our hope is that these requests will decrease the amount of time that you wait before being seen by our physicians.       _____________________________________________________________  Should you have questions after your visit to [facility], please contact our office at [phone] between the hours of 8:00 a.m. and 4:30 p.m.  Voicemails left after 4:00 p.m. will not be returned until the following business day.  For prescription refill requests, have your pharmacy contact our office and allow 72 hours.    Cancer Center Support Programs:   > Cancer Support Group  2nd Tuesday of the month 1pm-2pm, Journey Room

## 2018-11-24 NOTE — Progress Notes (Signed)
[facility] NOTE  Patient Care Team: Caryl Bis, MD as PCP - General (Unknown Physician Specialty)  CHIEF COMPLAINTS/PURPOSE OF CONSULTATION:  Left upper lobe lung nodule  HISTORY OF PRESENTING ILLNESS:  Tanya Galloway 53 y.o. female is seen in consultation today for further work-up and management of left upper lobe lung nodule.  She reported left-sided chest wall pain for the past couple of months.  She was initially referred for mammogram on 09/14/2018 which showed BI-RADS 1.  As the pain has gotten slightly worse, she came to the ER on 11/10/2018.  A chest x-ray showed suspicious left lung nodule.  CT scan done of the chest with contrast showed left lingular nodule measuring 2 cm and has peripheral spiculated margins concerning for neoplasm.  Lesion touches both the left pleura and oblique fissure.  Tiny lateral right lower lobe lung nodule measuring 3 mm was seen.  A PET CT scan was ordered and done yesterday.  She quit smoking 9 years ago, smoked 1 to 2 packs/day for 20 years.  She has lost about 5 pounds in the last 6 months.  Denies any new cough or hemoptysis.  She does report some seasonal allergies.  She works as a Microbiologist.  Denies any alcohol history.  Family history significant for father with melanoma.  Paternal grandmother had pancreatic cancer.  Paternal aunt had breast cancer.  She is able to do all her day-to-day activities without any problems.  Denies any vision changes or headaches which are new onset.  Denies any nausea, vomiting, diarrhea or constipation.  Denies any tingling or numbness in extremities.  She is not requiring any pain medication for the left chest wall pain which is on and off.  MEDICAL HISTORY:  Past Medical History:  Diagnosis Date  . Diabetes mellitus without complication ([place])   . Diverticulosis   . GERD (gastroesophageal reflux disease)   . H/O seasonal allergies   . Hemorrhoids   . Hypertension    . Irritable bowel syndrome     SURGICAL HISTORY: Past Surgical History:  Procedure Laterality Date  . COLONOSCOPY N/A 03/11/2018   Procedure: COLONOSCOPY;  Surgeon: Rogene Houston, MD;  [facility];  Service: Endoscopy;  Laterality: N/A;  930  . KNEE ARTHROSCOPY  2011   left knee  . POLYPECTOMY  03/11/2018   Procedure: POLYPECTOMY;  Surgeon: Rogene Houston, MD;  [facility];  Service: Endoscopy;;  splenic flexure (CSx1), transverse colon (CBx1), hepatic flexure (CSx1), descending colon (CSx1)distal sigmoid colon(CSx3)    SOCIAL HISTORY: Social History   Socioeconomic History  . Marital status: Married    Spouse name: Haja Crego.  . Number of children: Not on file  . Years of education: Not on file  . Highest education level: Not on file  Occupational History  . Not on file  Social Needs  . Financial resource strain: Not hard at all  . Food insecurity    Worry: Never true    Inability: Never true  . Transportation needs    Medical: No    Non-medical: No  Tobacco Use  . Smoking status: Former Smoker    Types: Cigarettes    Quit date: 09/15/2009    Years since quitting: 9.1  . Smokeless tobacco: Never Used  . Tobacco comment: Patient smoked for 25 years, 1 1/2 ppd  Substance and Sexual Activity  . Alcohol use: Yes    Comment: Twice a Year  .  Drug use: No  . Sexual activity: Not on file  Lifestyle  . Physical activity    Days per week: 1 day    Minutes per session: 10 min  . Stress: Only a little  Relationships  . Social connections    Talks on phone: More than three times a week    Gets together: More than three times a week    Attends religious service: More than 4 times per year    Active member of club or organization: No    Attends meetings of clubs or organizations: Never    Relationship status: Married  . Intimate partner violence    Fear of current or ex partner: No    Emotionally abused: No    Physically abused: No     Forced sexual activity: No  Other Topics Concern  . Not on file  Social History Narrative  . Not on file    FAMILY HISTORY: Family History  Problem Relation Age of Onset  . Diabetes Mother   . Thyroid disease Mother   . Hypertension Mother   . Hypertension Father   . Hypertension Brother   . Healthy Brother   . Breast cancer Maternal Grandmother   . Colon cancer Neg Hx     ALLERGIES:  is allergic to sulfa antibiotics.  MEDICATIONS:  Current Outpatient Medications  Medication Sig Dispense Refill  . dicyclomine (BENTYL) 10 MG capsule Take 1 capsule (10 mg total) by mouth 2 (two) times daily before a meal. 180 capsule 3  . esomeprazole (NEXIUM) 20 MG capsule Take 20 mg by mouth daily before breakfast.    . Ibuprofen 200 MG CAPS Take 200 mg by mouth as needed.    Marland Kitchen lisinopril-hydrochlorothiazide (PRINZIDE,ZESTORETIC) 20-12.5 MG per tablet Take 1 tablet by mouth daily.    . metFORMIN (GLUCOPHAGE-XR) 500 MG 24 hr tablet Take 500-1,000 mg by mouth See admin instructions. Take 1 tablet (500 mg) in the morning & 2 tablets (1000 mg) by mouth in the evening.  2  . traMADol (ULTRAM) 50 MG tablet Take 50 mg by mouth daily as needed (pain.). For back pain      No current facility-administered medications for this visit.     REVIEW OF SYSTEMS:   Constitutional: Denies fevers, chills or abnormal night sweats Eyes: Denies blurriness of vision, double vision or watery eyes Ears, nose, mouth, throat, and face: Denies mucositis or sore throat Respiratory: Denies cough, dyspnea or wheezes Cardiovascular: Denies palpitation, chest discomfort or lower extremity swelling Gastrointestinal:  Denies nausea, heartburn or change in bowel habits Skin: Denies abnormal skin rashes Lymphatics: Denies new lymphadenopathy or easy bruising Neurological:Denies numbness, tingling or new weaknesses Behavioral/Psych: Mood is stable, no new changes  All other systems were reviewed with the patient and are  negative.  PHYSICAL EXAMINATION: ECOG PERFORMANCE STATUS: 0 - Asymptomatic  Vitals:   11/24/18 1303  BP: 138/78  Pulse: 95  Resp: 18  Temp: (!) 97.3 F (36.3 C)  SpO2: 97%   Filed Weights   11/24/18 1303  Weight: (!) 307 lb 14.4 oz (139.7 kg)    GENERAL:alert, no distress and comfortable SKIN: skin color, texture, turgor are normal, no rashes or significant lesions EYES: normal, conjunctiva are pink and non-injected, sclera clear OROPHARYNX:no exudate, no erythema and lips, buccal mucosa, and tongue normal  NECK: supple, thyroid normal size, non-tender, without nodularity LYMPH:  no palpable lymphadenopathy in the cervical, axillary or inguinal LUNGS: clear to auscultation and percussion with normal breathing effort  HEART: regular rate & rhythm and no murmurs and no lower extremity edema ABDOMEN:abdomen soft, non-tender and normal bowel sounds Musculoskeletal:no cyanosis of digits and no clubbing  PSYCH: alert & oriented x 3 with fluent speech NEURO: no focal motor/sensory deficits  LABORATORY DATA:  I have reviewed the data as listed Lab Results  Component Value Date   HGB 14.3 01/04/2010   HCT 42.0 01/04/2010     Chemistry      Component Value Date/Time   NA 139 11/10/2018 1159   K 4.6 11/10/2018 1159   CL 100 11/10/2018 1159   CO2 28 11/10/2018 1159   BUN 13 11/10/2018 1159   CREATININE 0.80 11/10/2018 1159      Component Value Date/Time   CALCIUM 9.1 11/10/2018 1159       RADIOGRAPHIC STUDIES: I have personally reviewed the radiological images as listed and agreed with the findings in the report. Dg Ribs Unilateral W/chest Left  Result Date: 11/10/2018 CLINICAL DATA:  Left breast pain. EXAM: LEFT RIBS AND CHEST - 3+ VIEW COMPARISON:  None. FINDINGS: Heart size is normal. Mediastinal shadows are normal. The pulmonary vascularity is normal. 1 cm density in the left mid lung is worrisome for a mass lesion. Chest CT suggested. Left rib films performed with a  marker in the region of concern do not show any rib or chest wall abnormality. IMPRESSION: No evidence of acute or focal rib finding. 1 cm density in the left midlung that could represent a tumor. Chest CT recommended. Otherwise no active disease. Electronically Signed   By: Nelson Chimes M.D.   On: 11/10/2018 12:03   Ct Chest W Contrast  Result Date: 11/10/2018 CLINICAL DATA:  Left-sided anterior chest pain under the left breast for 2-3 weeks. EXAM: CT CHEST WITH CONTRAST TECHNIQUE: Multidetector CT imaging of the chest was performed during intravenous contrast administration. CONTRAST:  73mL OMNIPAQUE IOHEXOL 300 MG/ML  SOLN COMPARISON:  None. FINDINGS: Cardiovascular: Normal heart size.  No pericardial effusion. Mediastinum/Nodes: Normal appearance of the thyroid gland. The trachea appears patent and is midline. Normal appearance of the esophagus. No mediastinal or hilar adenopathy. Lungs/Pleura: No pleural effusion. Mild changes of emphysema. No airspace consolidation or atelectasis. Nodule within the lingula measures 2 cm, image 138/6, and has peripheral spiculated margins concerning for underlying pulmonary neoplasm. The lesion touches both the lateral pleura and oblique fissure, image 49/3. Tiny lateral right lower lobe lung nodule measures 3 mm, image 71/4. Upper Abdomen: The liver appears enlarged with hypertrophy of both the lateral segment of left lobe of liver and caudate lobe of liver. There is diffuse hepatic steatosis noted. No acute findings identified within the abdomen. Musculoskeletal: No chest wall abnormality. No acute or significant osseous findings. IMPRESSION: 1. Lingular nodule with spiculated margins measures 2 cm and is suspicious for primary bronchogenic carcinoma. Further evaluation with PET-CT is recommended. 2.  Emphysema (ICD10-J43.9). 3. Hepatic steatosis with morphologic features of liver suggestive of early cirrhosis. Electronically Signed   By: Kerby Moors M.D.   On:  11/10/2018 14:48   Nm Pet Image Initial (pi) Skull Base To Thigh  Result Date: 11/23/2018 CLINICAL DATA:  Initial treatment strategy for lung mass. Chest CT lung abnormality. EXAM: NUCLEAR MEDICINE PET SKULL BASE TO THIGH TECHNIQUE: Fifteen mCi F-18 FDG was injected intravenously. Full-ring PET imaging was performed from the skull base to thigh after the radiotracer. CT data was obtained and used for attenuation correction and anatomic localization. Fasting blood glucose: 98 mg/dl COMPARISON:  CT  11/10/2018 FINDINGS: Mediastinal blood pool activity: SUV max 4.1 Liver activity: SUV max NA NECK: No hypermetabolic lymph nodes in the neck. Incidental CT findings: none CHEST: The pulmonary nodule concern in the LEFT upper lobe measures maximum diameter 1.7 cm on image 26/8. This nodule does have associated moderate metabolic activity SUV max equal 4.2. Nodules not changed in size from CT 11/10/2018 No additional suspicious or metabolic lymph nodes pulmonary nodules in the LEFT or RIGHT lung. No hypermetabolic mediastinal lymph nodes or enlarged mediastinal lymph nodes. Incidental CT findings: None ABDOMEN/PELVIS: No abnormal hypermetabolic activity within the liver, pancreas, adrenal glands, or spleen. No hypermetabolic lymph nodes in the abdomen or pelvis. Incidental CT findings: Hepatic steatosis. Normal adrenal glands. Uterus and adnexa normal. SKELETON: Single focus of metabolic activity within the mid vertebral body at T12 vertebral body level. No associated CT findings. Activity is moderate SUV max equal 5.6. Incidental CT findings: None IMPRESSION: 1. Hypermetabolic nodule in the LEFT upper lobe not resolved over 2 week interval. Findings concerning for bronchogenic carcinoma. Consider tissue sampling versus short-term CT follow-up (1 month) to exclude pulmonary infection. 2. No hypermetabolic or enlarged mediastinal lymph nodes. 3. Single focus of metabolic activity in the U72 vertebral body is favored NON  malignant. These results will be called to the ordering clinician or representative by the Radiologist Assistant, and communication documented in the PACS or zVision Dashboard. Electronically Signed   By: Suzy Bouchard M.D.   On: 11/23/2018 12:08    ASSESSMENT & PLAN:  Nodule of left lung 1.  Left upper lobe lung nodule: - Patient reported 75-month history of left chest wall pain. -She was evaluated in the ER on 11/10/2018.  Chest x-ray showed abnormal findings. - Left lingular nodule measures 2 cm and has peripheral spiculated margins concerning for neoplasm.  The lesion touches both the left pleura and oblique fissure.  Tiny right lower lobe lateral lung nodule measuring 3 mm. - PET CT scan on 11/23/2018 shows left upper lobe nodule measuring 1.7 cm with SUV of 4.2.  Single focus of metabolic activity within the mid vertebral body at T12 level.  SUV of 5.6. - She quit smoking 9 years ago, smoked 1 to 2 packs/day for 20 years. - I had a prolonged discussion with the patient and her husband who is on the phone.  I have recommended imaging of the brain to complete work-up. -Because of the suspicious findings on the T12 vertebral body, I have recommended MRI of the thoracic spine with and without contrast.  Patient reportedly is claustrophobic.  We will coordinate this process with radiology by giving some Ativan. -We will also schedule her for pulmonary function testing.  I will also make a referral to Dr. Roxan Hockey in [place].  2.  Health maintenance: -Mammogram on 09/14/2018 was BI-RADS Category 1. -Colonoscopy on 03/11/2018 showed 7 4 to 6 mm polyps in the sigmoid colon, in the descending colon, at the splenic flexure, in the transverse colon and at the hepatic flexure.  Diverticulosis in the sigmoid colon.  Pathology consistent with tubular adenoma, sessile serrated polyp without dysplasia, hyperplastic polyp.  Orders Placed This Encounter  Procedures  . MR Thoracic Spine W Wo Contrast     Standing Status:   Future    Standing Expiration Date:   11/24/2019    Order Specific Question:   GRA to provide read?    Answer:   Yes    Order Specific Question:   If indicated for the ordered procedure, I  authorize the administration of contrast media per Radiology protocol    Answer:   Yes    Order Specific Question:   What is the patient's sedation requirement?    Answer:   No Sedation    Order Specific Question:   Use SRS Protocol?    Answer:   No    Order Specific Question:   Does the patient have a pacemaker or implanted devices?    Answer:   No    Order Specific Question:   Preferred imaging location?    Answer:   [facility] (table limit-350lbs)    Order Specific Question:   Radiology Contrast Protocol - do NOT remove file path    Answer:   \\[email or web address]  . CT Head W Wo Contrast    Standing Status:   Future    Standing Expiration Date:   11/24/2019    Order Specific Question:   ** REASON FOR EXAM (FREE TEXT)    Answer:   lung mass    Order Specific Question:   If indicated for the ordered procedure, I authorize the administration of contrast media per Radiology protocol    Answer:   Yes    Order Specific Question:   Is patient pregnant?    Answer:   No    Order Specific Question:   Preferred imaging location?    Answer:   [facility]    Order Specific Question:   Radiology Contrast Protocol - do NOT remove file path    Answer:   \\[email or web address].pdf  . Pulmonary Function Test    Standing Status:   Future    Standing Expiration Date:   11/24/2019    Order Specific Question:   Where should this test be performed?    Answer:   [facility]    Order Specific Question:   Full PFT: includes the following: basic spirometry, spirometry pre & post bronchodilator, diffusion capacity (DLCO), lung volumes    Answer:   Full PFT    All questions were answered. The patient knows to call the clinic with any problems,  questions or concerns.      Derek Jack, MD 11/24/2018 1:58 PM

## 2018-11-25 ENCOUNTER — Other Ambulatory Visit (HOSPITAL_COMMUNITY): Payer: Self-pay | Admitting: *Deleted

## 2018-11-25 ENCOUNTER — Encounter (HOSPITAL_COMMUNITY): Payer: Self-pay | Admitting: *Deleted

## 2018-11-25 DIAGNOSIS — R911 Solitary pulmonary nodule: Secondary | ICD-10-CM

## 2018-11-25 NOTE — OR Nursing (Signed)
Abbie Sons RN spoke with Amy in specialty clinic regarding covid test six days prior to procedure.  Per Amy, Per Respiratory, covid test is ok due to patient has been advised to quarantine after testing.

## 2018-11-25 NOTE — Progress Notes (Signed)
I spoke with Dr. Delton Coombes today about patient's complaint of claustrophobia.  He wants patient to come in for IV infusion of ativan 2 mg 1 hour prior to scan. He wants patient to be assessed 20 minutes before scan and if she needs to be redosed we can do so at that time. Patient is aware of appt time before scan.  Orders will be placed date of service.

## 2018-11-25 NOTE — Progress Notes (Signed)
I met with patient during the visit with Dr. Delton Coombes on 8/19.  She was here alone but had her husband, Abe People, on the phone.  I explained to them how I will be involved in her care. I provided my contact information so that she can reach me should she have any questions or concerns.  She verbalizes appreciation and understanding.

## 2018-11-26 ENCOUNTER — Other Ambulatory Visit (HOSPITAL_COMMUNITY)
Admission: RE | Admit: 2018-11-26 | Discharge: 2018-11-26 | Disposition: A | Discharge status: Home / Self Care | Payer: PRIVATE HEALTH INSURANCE | Source: Ambulatory Visit | Attending: Hematology | Admitting: Hematology

## 2018-11-26 ENCOUNTER — Other Ambulatory Visit: Payer: Self-pay

## 2018-11-26 DIAGNOSIS — Z01812 Encounter for preprocedural laboratory examination: Secondary | ICD-10-CM | POA: Diagnosis present

## 2018-11-26 DIAGNOSIS — R911 Solitary pulmonary nodule: Secondary | ICD-10-CM | POA: Insufficient documentation

## 2018-11-26 DIAGNOSIS — Z20828 Contact with and (suspected) exposure to other viral communicable diseases: Secondary | ICD-10-CM | POA: Diagnosis not present

## 2018-11-26 LAB — SARS CORONAVIRUS 2 (TAT 6-24 HRS): SARS Coronavirus 2: NEGATIVE

## 2018-12-01 ENCOUNTER — Other Ambulatory Visit: Payer: Self-pay

## 2018-12-01 ENCOUNTER — Ambulatory Visit (HOSPITAL_COMMUNITY)
Admission: RE | Admit: 2018-12-01 | Discharge: 2018-12-01 | Disposition: A | Discharge status: Home / Self Care | Payer: PRIVATE HEALTH INSURANCE | Source: Ambulatory Visit | Attending: Hematology | Admitting: Hematology

## 2018-12-01 DIAGNOSIS — R911 Solitary pulmonary nodule: Secondary | ICD-10-CM | POA: Insufficient documentation

## 2018-12-01 LAB — PULMONARY FUNCTION TEST
DL/VA % pred: 133 %
DL/VA: 5.55 ml/min/mmHg/L
DLCO unc % pred: 125 %
DLCO unc: 30.94 ml/min/mmHg
FEF 25-75 Post: 2.18 L/sec
FEF 25-75 Pre: 1.86 L/sec
FEF2575-%Change-Post: 17 %
FEF2575-%Pred-Post: 72 %
FEF2575-%Pred-Pre: 61 %
FEV1-%Change-Post: 4 %
FEV1-%Pred-Post: 84 %
FEV1-%Pred-Pre: 80 %
FEV1-Post: 2.76 L
FEV1-Pre: 2.64 L
FEV1FVC-%Change-Post: 3 %
FEV1FVC-%Pred-Pre: 89 %
FEV6-%Change-Post: 2 %
FEV6-%Pred-Post: 92 %
FEV6-%Pred-Pre: 90 %
FEV6-Post: 3.73 L
FEV6-Pre: 3.64 L
FEV6FVC-%Change-Post: 1 %
FEV6FVC-%Pred-Post: 102 %
FEV6FVC-%Pred-Pre: 101 %
FVC-%Change-Post: 1 %
FVC-%Pred-Post: 90 %
FVC-%Pred-Pre: 89 %
FVC-Post: 3.74 L
FVC-Pre: 3.7 L
Post FEV1/FVC ratio: 74 %
Post FEV6/FVC ratio: 100 %
Pre FEV1/FVC ratio: 71 %
Pre FEV6/FVC Ratio: 98 %
RV % pred: 102 %
RV: 2.1 L
TLC % pred: 97 %
TLC: 5.64 L

## 2018-12-01 MED ORDER — ALBUTEROL SULFATE (2.5 MG/3ML) 0.083% IN NEBU
2.5000 mg | INHALATION_SOLUTION | Freq: Once | RESPIRATORY_TRACT | Status: AC
  Administered 2018-12-01: 15:00:00 2.5 mg via RESPIRATORY_TRACT

## 2018-12-03 ENCOUNTER — Inpatient Hospital Stay (HOSPITAL_COMMUNITY): Payer: PRIVATE HEALTH INSURANCE

## 2018-12-03 ENCOUNTER — Ambulatory Visit (HOSPITAL_COMMUNITY)
Admission: RE | Admit: 2018-12-03 | Discharge: 2018-12-03 | Disposition: A | Discharge status: Home / Self Care | Payer: PRIVATE HEALTH INSURANCE | Source: Ambulatory Visit | Attending: Hematology | Admitting: Hematology

## 2018-12-03 ENCOUNTER — Encounter (HOSPITAL_COMMUNITY): Payer: Self-pay

## 2018-12-03 ENCOUNTER — Other Ambulatory Visit: Payer: Self-pay

## 2018-12-03 DIAGNOSIS — R911 Solitary pulmonary nodule: Secondary | ICD-10-CM | POA: Diagnosis not present

## 2018-12-03 MED ORDER — LORAZEPAM 2 MG/ML IJ SOLN
2.0000 mg | Freq: Once | INTRAMUSCULAR | Status: AC
  Administered 2018-12-03: 2 mg via INTRAVENOUS

## 2018-12-03 MED ORDER — SODIUM CHLORIDE 0.9 % IV SOLN
Freq: Once | INTRAVENOUS | Status: AC
  Administered 2018-12-03: 12:00:00 via INTRAVENOUS

## 2018-12-03 MED ORDER — LORAZEPAM 2 MG/ML IJ SOLN
INTRAMUSCULAR | Status: AC
  Filled 2018-12-03: qty 1

## 2018-12-03 NOTE — Patient Instructions (Signed)
[facility]  Discharge Instructions:   _______________________________________________________________  Thank you for choosing [facility] at [facility] to provide your oncology and hematology care.  To afford each patient quality time with our providers, please arrive at least 15 minutes before your scheduled appointment.  You need to re-schedule your appointment if you arrive 10 or more minutes late.  We strive to give you quality time with our providers, and arriving late affects you and other patients whose appointments are after yours.  Also, if you no show three or more times for appointments you may be dismissed from the clinic.  Again, thank you for choosing [facility] at [facility]. Our hope is that these requests will allow you access to exceptional care and in a timely manner. _______________________________________________________________  If you have questions after your visit, please contact our office at [phone] between the hours of 8:30 a.m. and 5:00 p.m. Voicemails left after 4:30 p.m. will not be returned until the following business day. _______________________________________________________________  For prescription refill requests, have your pharmacy contact our office. _______________________________________________________________  Recommendations made by the consultant and any test results will be sent to your referring physician. _______________________________________________________________ 

## 2018-12-03 NOTE — Progress Notes (Signed)
Pt presents today for Ativan and IV only prior to MRI. Ativan 2 mg IV the hour before MRI then reassess 20 min before scan and give another 2mg  Ativan prior to scan. Pt assessed at 1255 and is requesting the other 2mg  of Ativan due to she still feels anxious. Pt unable to complete last MRI with PO Ativan medication and is concerned. Pt claustrophobic. VO received on 12/02/18 from [employer].   1305 Pt taken down to Radiology for MRI via wheel chair by [facility] CNA. Pt has no complaints. SL, 22 gauge in  the left AC,  intact and flushes with no resistance.   [facility] Rad Tech came to unit to notify us that pt was too large for the MRI machine and the MRI will not be done today.

## 2018-12-06 ENCOUNTER — Other Ambulatory Visit (HOSPITAL_COMMUNITY): Payer: Self-pay | Admitting: *Deleted

## 2018-12-06 ENCOUNTER — Ambulatory Visit (HOSPITAL_COMMUNITY): Payer: PRIVATE HEALTH INSURANCE

## 2018-12-06 ENCOUNTER — Telehealth (HOSPITAL_COMMUNITY): Payer: Self-pay | Admitting: Hematology

## 2018-12-06 NOTE — Telephone Encounter (Signed)
Orders received from Dr. Delton Coombes for ativan 1.5 hours prior to MRI and then 30 minutes prior to MRI for claustrophobia.  Request sent to Dr. Delton Coombes to send to her pharmacy.

## 2018-12-06 NOTE — Telephone Encounter (Signed)
Pt having scans outside of the [facility] network. Called [facility] with Shirlean Mylar and had previous auth withdrawn.  AUTH# [id number]

## 2018-12-07 MED ORDER — LORAZEPAM 1 MG PO TABS: ORAL_TABLET | ORAL | 0 refills | Status: DC

## 2018-12-09 ENCOUNTER — Ambulatory Visit (HOSPITAL_COMMUNITY): Payer: PRIVATE HEALTH INSURANCE | Admitting: Hematology

## 2018-12-14 ENCOUNTER — Encounter: Payer: Self-pay | Admitting: Hematology

## 2018-12-16 ENCOUNTER — Ambulatory Visit (HOSPITAL_COMMUNITY): Payer: PRIVATE HEALTH INSURANCE | Admitting: Hematology

## 2018-12-16 ENCOUNTER — Other Ambulatory Visit: Payer: Self-pay

## 2018-12-16 ENCOUNTER — Inpatient Hospital Stay (HOSPITAL_COMMUNITY): Payer: PRIVATE HEALTH INSURANCE | Attending: Hematology | Admitting: Hematology

## 2018-12-16 ENCOUNTER — Encounter (HOSPITAL_COMMUNITY): Payer: Self-pay | Admitting: Hematology

## 2018-12-16 DIAGNOSIS — E119 Type 2 diabetes mellitus without complications: Secondary | ICD-10-CM | POA: Diagnosis not present

## 2018-12-16 DIAGNOSIS — R911 Solitary pulmonary nodule: Secondary | ICD-10-CM

## 2018-12-16 DIAGNOSIS — I10 Essential (primary) hypertension: Secondary | ICD-10-CM | POA: Diagnosis not present

## 2018-12-16 NOTE — Progress Notes (Signed)
[place], [place] [address]   CLINIC:  Medical Oncology/Hematology  PCP:  Caryl Bis, MD [place] [address] [phone]   REASON FOR VISIT:  Follow-up for lung nodule, MRI of the thoracic spine and head.  CURRENT THERAPY: Possible surgical resection.   INTERVAL HISTORY:  Tanya Galloway 53 y.o. female seen for follow-up of left lung nodule in the upper lobe measuring 1.7 cm.  Denies any cough or hemoptysis.  Denies any chest wall pain at this time.  She had MRI of the thoracic spine and the brain done in [facility] radiology as she is claustrophobic.  She took  Xanax prior to the scans and tolerated them very well.  Denies any back pain or headaches.  She also completed pulmonary function.  Appetite and energy levels are 75%.  Tingling in the hands has been stable.    REVIEW OF SYSTEMS:  Review of Systems  Neurological: Positive for numbness.  All other systems reviewed and are negative.    PAST MEDICAL/SURGICAL HISTORY:  Past Medical History:  Diagnosis Date  . Diabetes mellitus without complication ([place])   . Diverticulosis   . GERD (gastroesophageal reflux disease)   . H/O seasonal allergies   . Hemorrhoids   . Hypertension   . Irritable bowel syndrome    Past Surgical History:  Procedure Laterality Date  . COLONOSCOPY N/A 03/11/2018   Procedure: COLONOSCOPY;  Surgeon: Rogene Houston, MD;  [facility];  Service: Endoscopy;  Laterality: N/A;  930  . KNEE ARTHROSCOPY  2011   left knee  . POLYPECTOMY  03/11/2018   Procedure: POLYPECTOMY;  Surgeon: Rogene Houston, MD;  Location: [facility];  Service: Endoscopy;;  splenic flexure (CSx1), transverse colon (CBx1), hepatic flexure (CSx1), descending colon (CSx1)distal sigmoid colon(CSx3)     SOCIAL HISTORY:  Social History   Socioeconomic History  . Marital status: Married    Spouse name: Nychelle Cassata.  . Number of children: Not on file  . Years of  education: Not on file  . Highest education level: Not on file  Occupational History  . Not on file  Social Needs  . Financial resource strain: Not hard at all  . Food insecurity    Worry: Never true    Inability: Never true  . Transportation needs    Medical: No    Non-medical: No  Tobacco Use  . Smoking status: Former Smoker    Types: Cigarettes    Quit date: 09/15/2009    Years since quitting: 9.2  . Smokeless tobacco: Never Used  . Tobacco comment: Patient smoked for 25 years, 1 1/2 ppd  Substance and Sexual Activity  . Alcohol use: Yes    Comment: Twice a Year  . Drug use: No  . Sexual activity: Not on file  Lifestyle  . Physical activity    Days per week: 1 day    Minutes per session: 10 min  . Stress: Only a little  Relationships  . Social connections    Talks on phone: More than three times a week    Gets together: More than three times a week    Attends religious service: More than 4 times per year    Active member of club or organization: No    Attends meetings of clubs or organizations: Never    Relationship status: Married  . Intimate partner violence    Fear of current or ex partner:  No    Emotionally abused: No    Physically abused: No    Forced sexual activity: No  Other Topics Concern  . Not on file  Social History Narrative  . Not on file    FAMILY HISTORY:  Family History  Problem Relation Age of Onset  . Diabetes Mother   . Thyroid disease Mother   . Hypertension Mother   . Hypertension Father   . Hypertension Brother   . Healthy Brother   . Breast cancer Maternal Grandmother   . Colon cancer Neg Hx     CURRENT MEDICATIONS:  Outpatient Encounter Medications as of 12/16/2018  Medication Sig  . dicyclomine (BENTYL) 10 MG capsule Take 1 capsule (10 mg total) by mouth 2 (two) times daily before a meal.  . lisinopril-hydrochlorothiazide (PRINZIDE,ZESTORETIC) 20-12.5 MG per tablet Take 1 tablet by mouth daily.  . metFORMIN (GLUCOPHAGE-XR)  500 MG 24 hr tablet Take 500-1,000 mg by mouth See admin instructions. Take 1 tablet (500 mg) in the morning & 2 tablets (1000 mg) by mouth in the evening.  Marland Kitchen esomeprazole (NEXIUM) 20 MG capsule Take 20 mg by mouth as needed.   . Ibuprofen 200 MG CAPS Take 200 mg by mouth as needed.  . traMADol (ULTRAM) 50 MG tablet Take 50 mg by mouth daily as needed (pain.). For back pain   . [DISCONTINUED] LORazepam (ATIVAN) 1 MG tablet Take 2 tablets 1.5 hours prior to MRI, then take 2 tablets 30 minutes prior to MRI.   No facility-administered encounter medications on file as of 12/16/2018.     ALLERGIES:  Allergies  Allergen Reactions  . Sulfa Antibiotics Nausea Only and Swelling     PHYSICAL EXAM:  ECOG Performance status: 0  Vitals:   12/16/18 1450  BP: 124/78  Pulse: 80  Resp: (!) 116  Temp: (!) 97.1 F (36.2 C)  SpO2: 98%   Filed Weights   12/16/18 1450  Weight: (!) 304 lb 8 oz (138.1 kg)    Physical Exam Vitals signs reviewed.  Constitutional:      Appearance: Normal appearance.  Cardiovascular:     Rate and Rhythm: Normal rate and regular rhythm.     Heart sounds: Normal heart sounds.  Pulmonary:     Effort: Pulmonary effort is normal.     Breath sounds: Normal breath sounds.  Abdominal:     General: There is no distension.     Palpations: Abdomen is soft. There is no mass.  Musculoskeletal:        General: No swelling.  Skin:    General: Skin is warm.  Neurological:     General: No focal deficit present.     Mental Status: She is alert and oriented to person, place, and time.  Psychiatric:        Mood and Affect: Mood normal.        Behavior: Behavior normal.      LABORATORY DATA:  I have reviewed the labs as listed.  CBC    Component Value Date/Time   HGB 14.3 01/04/2010 1208   HCT 42.0 01/04/2010 1208   CMP Latest Ref Rng & Units 11/10/2018 01/04/2010  Glucose 70 - 99 mg/dL 146(H) 114(H)  BUN 6 - 20 mg/dL 13 -  Creatinine 0.44 - 1.00 mg/dL 0.80 -   Sodium 135 - 145 mmol/L 139 140  Potassium 3.5 - 5.1 mmol/L 4.6 3.6  Chloride 98 - 111 mmol/L 100 -  CO2 22 - 32 mmol/L 28 -  Calcium  8.9 - 10.3 mg/dL 9.1 -       DIAGNOSTIC IMAGING:  I have independently reviewed the scans and discussed with the patient.     ASSESSMENT & PLAN:   Nodule of left lung 1.  Left upper lobe lung nodule: - Patient felt pain in the left chest wall, went to the ER on 11/10/2018, chest x-ray showed abnormal findings. - PET scan on 11/23/2018 shows left upper lobe lung nodule measuring 1.7 cm with SUV of 4.2.  Single focus of metabolic activity within the mid vertebral body at T12 level.  SUV 5.6. -Quit smoking 9 years ago.  Smoked 1 to 2 packs/day for 20 years. 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 -We also reviewed the MRI of the brain with and without contrast on 12/14/2018 which did not show any evidence of metastatic disease.  The scans were done at [facility] radiology as patient claustrophobic. - She has completed pulmonary function testing.  I have made a referral to Dr. Roxan Hockey for surgical resection. -I will see her back in 6 weeks for follow-up to review pathology.  2.  Health maintenance: -Mammogram on 09/14/2018 was BI-RADS Category 1. -Colonoscopy on 03/11/2018 showed 4 to 6 mm polyps, 7 in number in the sigmoid colon, in the descending colon at the splenic flexure, in the transverse colon and at the hepatic flexure.  Diverticulosis in the sigmoid colon.  Pathology consistent with tubular adenoma, sessile serrated polyp without dysplasia, hyperplastic polyp.   Total time spent is 25 minutes with more than 50% of time spent face-to-face discussing scan results, treatment plan, counseling and coordination of care.  Orders placed this encounter:  No orders of the defined  types were placed in this encounter.     Derek Jack, MD [place] [phone]

## 2018-12-16 NOTE — Patient Instructions (Addendum)
[place] at [facility] Discharge Instructions  You were seen today by Dr. Delton Coombes. He went over your recent scan results. Your scans were good. This is an early , stage 1 lung cancer. He will refer you to a thoracic surgeon to discuss surgery to remove your cancer. He will see you back in 6 weeks for follow up.   Thank you for choosing [place] at [facility] to provide your oncology and hematology care.  To afford each patient quality time with our provider, please arrive at least 15 minutes before your scheduled appointment time.   If you have a lab appointment with the [facility] please come in thru the  Main Entrance and check in at the main information desk  You need to re-schedule your appointment should you arrive 10 or more minutes late.  We strive to give you quality time with our providers, and arriving late affects you and other patients whose appointments are after yours.  Also, if you no show three or more times for appointments you may be dismissed from the clinic at the providers discretion.     Again, thank you for choosing [facility].  Our hope is that these requests will decrease the amount of time that you wait before being seen by our physicians.       _____________________________________________________________  Should you have questions after your visit to [facility], please contact our office at [phone] between the hours of 8:00 a.m. and 4:30 p.m.  Voicemails left after 4:00 p.m. will not be returned until the following business day.  For prescription refill requests, have your pharmacy contact our office and allow 72 hours.    Cancer Center Support Programs:   > Cancer Support Group  2nd Tuesday of the month 1pm-2pm, Journey Room

## 2018-12-16 NOTE — Assessment & Plan Note (Signed)
1.  Left upper lobe lung nodule: - Patient felt pain in the left chest wall, went to the ER on 11/10/2018, chest x-ray showed abnormal findings. - PET scan on 11/23/2018 shows left upper lobe lung nodule measuring 1.7 cm with SUV of 4.2.  Single focus of metabolic activity within the mid vertebral body at T12 level.  SUV 5.6. -Quit smoking 9 years ago.  Smoked 1 to 2 packs/day for 20 years. 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 -We also reviewed the MRI of the brain with and without contrast on 12/14/2018 which did not show any evidence of metastatic disease.  The scans were done at [facility] radiology as patient claustrophobic. - She has completed pulmonary function testing.  I have made a referral to Dr. Roxan Hockey for surgical resection. -I will see her back in 6 weeks for follow-up to review pathology.  2.  Health maintenance: -Mammogram on 09/14/2018 was BI-RADS Category 1. -Colonoscopy on 03/11/2018 showed 4 to 6 mm polyps, 7 in number in the sigmoid colon, in the descending colon at the splenic flexure, in the transverse colon and at the hepatic flexure.  Diverticulosis in the sigmoid colon.  Pathology consistent with tubular adenoma, sessile serrated polyp without dysplasia, hyperplastic polyp.

## 2018-12-22 NOTE — Progress Notes (Signed)
[address]       [place] [address]             [phone]                    Aqua J Surles [facility] Medical Record #[id number] Date of Birth: 01-06-1966  Referring: Derek Jack, MD Primary Care: Caryl Bis, MD Primary Cardiologist: No primary care provider on file.  Chief Complaint:   No chief complaint on file.   History of Present Illness:    Tanya Galloway 53 y.o. female for surgical evaluation of the left upper artery.  States that couple months ago she started developing some left chest pain that wax and wane and that she originally thought it was from.  She underwent further evaluation and cross-sectional imaging revealed a 2 cm left upper lobe.  PET/CT did show some avidity in the node.  She denies any neurologic symptoms and her weight has been stable.  Occasionally has some shortness of breath with strenuous activity.  Does admit to having intermittent left chest pain under breast.    Smoking Hx: She quit smoking 9 years ago, smoked 1 to 2 packs/day for 20 years.   Zubrod Score: At the time of surgery this patient's most appropriate activity status/level should be described as: [x]     0    Normal activity, no symptoms []     1    Restricted in physical strenuous activity but ambulatory, able to do out light work []     2    Ambulatory and capable of self care, unable to do work activities, up and about               >50 % of waking hours                              []     3    Only limited self care, in bed greater than 50% of waking hours []     4    Completely disabled, no self care, confined to bed or chair []     5    Moribund   Past Medical History:  Diagnosis Date  . Diabetes mellitus without complication ([place])   . Diverticulosis   . GERD (gastroesophageal reflux disease)   . H/O seasonal allergies   . Hemorrhoids   . Hypertension   . Irritable bowel syndrome     Past Surgical History:  Procedure Laterality Date  .  COLONOSCOPY N/A 03/11/2018   Procedure: COLONOSCOPY;  Surgeon: Rogene Houston, MD;  [facility];  Service: Endoscopy;  Laterality: N/A;  930  . KNEE ARTHROSCOPY  2011   left knee  . POLYPECTOMY  03/11/2018   Procedure: POLYPECTOMY;  Surgeon: Rogene Houston, MD;  [facility];  Service: Endoscopy;;  splenic flexure (CSx1), transverse colon (CBx1), hepatic flexure (CSx1), descending colon (CSx1)distal sigmoid colon(CSx3)    Family History  Problem Relation Age of Onset  . Diabetes Mother   . Thyroid disease Mother   . Hypertension Mother   . Hypertension Father   . Hypertension Brother   . Healthy Brother   . Breast cancer Maternal Grandmother   . Colon cancer Neg Hx      Social History   Tobacco Use  Smoking Status Former Smoker  . Types: Cigarettes  . Quit date: 09/15/2009  . Years  since quitting: 9.2  Smokeless Tobacco Never Used  Tobacco Comment   Patient smoked for 25 years, 1 1/2 ppd    Social History   Substance and Sexual Activity  Alcohol Use Yes   Comment: Twice a Year     Allergies  Allergen Reactions  . Sulfa Antibiotics Nausea Only and Swelling    Current Outpatient Medications  Medication Sig Dispense Refill  . dicyclomine (BENTYL) 10 MG capsule Take 1 capsule (10 mg total) by mouth 2 (two) times daily before a meal. 180 capsule 3  . esomeprazole (NEXIUM) 20 MG capsule Take 20 mg by mouth as needed.     . Ibuprofen 200 MG CAPS Take 200 mg by mouth as needed.    Marland Kitchen lisinopril-hydrochlorothiazide (PRINZIDE,ZESTORETIC) 20-12.5 MG per tablet Take 1 tablet by mouth daily.    . metFORMIN (GLUCOPHAGE-XR) 500 MG 24 hr tablet Take 500-1,000 mg by mouth See admin instructions. Take 1 tablet (500 mg) in the morning & 2 tablets (1000 mg) by mouth in the evening.  2  . traMADol (ULTRAM) 50 MG tablet Take 50 mg by mouth daily as needed (pain.). For back pain      No current facility-administered medications for this visit.     Review of  Systems  Constitutional: Negative for chills, diaphoresis, fever, malaise/fatigue and weight loss.  HENT: Negative.   Eyes: Negative.   Respiratory: Negative for cough, sputum production and shortness of breath.   Cardiovascular: Negative.   Gastrointestinal: Positive for diarrhea.  Musculoskeletal: Positive for back pain and myalgias.  Skin: Negative.   Neurological: Negative.      PHYSICAL EXAMINATION: There were no vitals taken for this visit. Physical Exam  Constitutional: She is oriented to person, place, and time. She appears well-developed and well-nourished. No distress.  HENT:  Head: Normocephalic and atraumatic.  Eyes: Conjunctivae and EOM are normal. Right eye exhibits no discharge. Left eye exhibits no discharge.  Neck: Normal range of motion. Neck supple. No tracheal deviation present.  Cardiovascular: Normal rate, regular rhythm and normal heart sounds.  No murmur heard. Respiratory: Effort normal. No respiratory distress.  GI: She exhibits no distension.  Musculoskeletal: Normal range of motion.        General: No edema.  Neurological: She is alert and oriented to person, place, and time.  Skin: Skin is warm and dry. She is not diaphoretic.    Diagnostic Studies & Laboratory data:     Recent Radiology Findings:   Nm Pet Image Initial (pi) Skull Base To Thigh  Result Date: 11/23/2018 CLINICAL DATA:  Initial treatment strategy for lung mass. Chest CT lung abnormality. EXAM: NUCLEAR MEDICINE PET SKULL BASE TO THIGH TECHNIQUE: Fifteen mCi F-18 FDG was injected intravenously. Full-ring PET imaging was performed from the skull base to thigh after the radiotracer. CT data was obtained and used for attenuation correction and anatomic localization. Fasting blood glucose: 98 mg/dl COMPARISON:  CT 11/10/2018 FINDINGS: Mediastinal blood pool activity: SUV max 4.1 Liver activity: SUV max NA NECK: No hypermetabolic lymph nodes in the neck. Incidental CT findings: none CHEST: The  pulmonary nodule concern in the LEFT upper lobe measures maximum diameter 1.7 cm on image 26/8. This nodule does have associated moderate metabolic activity SUV max equal 4.2. Nodules not changed in size from CT 11/10/2018 No additional suspicious or metabolic lymph nodes pulmonary nodules in the LEFT or RIGHT lung. No hypermetabolic mediastinal lymph nodes or enlarged mediastinal lymph nodes. Incidental CT findings: None ABDOMEN/PELVIS: No abnormal hypermetabolic activity  within the liver, pancreas, adrenal glands, or spleen. No hypermetabolic lymph nodes in the abdomen or pelvis. Incidental CT findings: Hepatic steatosis. Normal adrenal glands. Uterus and adnexa normal. SKELETON: Single focus of metabolic activity within the mid vertebral body at T12 vertebral body level. No associated CT findings. Activity is moderate SUV max equal 5.6. Incidental CT findings: None IMPRESSION: 1. Hypermetabolic nodule in the LEFT upper lobe not resolved over 2 week interval. Findings concerning for bronchogenic carcinoma. Consider tissue sampling versus short-term CT follow-up (1 month) to exclude pulmonary infection. 2. No hypermetabolic or enlarged mediastinal lymph nodes. 3. Single focus of metabolic activity in the M41 vertebral body is favored NON malignant. These results will be called to the ordering clinician or representative by the Radiologist Assistant, and communication documented in the PACS or zVision Dashboard. Electronically Signed   By: Suzy Bouchard M.D.   On: 11/23/2018 12:08       I have independently reviewed the above radiology studies  and reviewed the findings with the patient.   Recent Lab Findings: Lab Results  Component Value Date   HGB 14.3 01/04/2010   HCT 42.0 01/04/2010   GLUCOSE 146 (H) 11/10/2018   NA 139 11/10/2018   K 4.6 11/10/2018   CL 100 11/10/2018   CREATININE 0.80 11/10/2018   BUN 13 11/10/2018   CO2 28 11/10/2018     PFTs: - FVC: 86% - FEV1: 80% -DLCO: 125%   Problem List: 2cm LUL pulmonary nodule SUV uptake of 4.2 T12 avidity with SUV of 5.6  Assessment / Plan:   59-year-old female with a 2 cm left upper lobe pulmonary nodule with increased SUV uptake.  In regards to this T12 spine avidity MRI spine showed that there was no evidence of disease.  She has a history of degenerative joint disease this likely corresponds to the area increased avidity.  Discussed several options for diagnosis and treatment of this pulmonary nodule CT-guided biopsy and a surgical biopsy.  In regards to treatment we discussed the options of surgical resection and SBRT.  She has agreed to proceed with surgical biopsy instrument.  She will require a stress test prior to surgery.  She is tentatively scheduled for early October for a bronchoscopy, left thoracoscopy, left upper lobe wedge, and possible lobectomy.  I  spent 40 minutes with  the patient face to face and greater then 50% of the time was spent in counseling and coordination of care.    Lajuana Matte 12/22/2018 11:37 AM

## 2018-12-24 ENCOUNTER — Other Ambulatory Visit: Payer: Self-pay | Admitting: Thoracic Surgery (Cardiothoracic Vascular Surgery)

## 2018-12-24 ENCOUNTER — Encounter: Payer: Self-pay | Admitting: Thoracic Surgery (Cardiothoracic Vascular Surgery)

## 2018-12-24 ENCOUNTER — Institutional Professional Consult (permissible substitution) (INDEPENDENT_AMBULATORY_CARE_PROVIDER_SITE_OTHER): Payer: PRIVATE HEALTH INSURANCE | Admitting: Thoracic Surgery (Cardiothoracic Vascular Surgery)

## 2018-12-24 ENCOUNTER — Other Ambulatory Visit: Payer: Self-pay

## 2018-12-24 ENCOUNTER — Other Ambulatory Visit: Payer: Self-pay | Admitting: *Deleted

## 2018-12-24 ENCOUNTER — Encounter: Payer: Self-pay | Admitting: *Deleted

## 2018-12-24 VITALS — BP 120/81 | HR 86 | Temp 96.3°F | Resp 16 | Ht 69.0 in | Wt 304.0 lb

## 2018-12-24 DIAGNOSIS — D381 Neoplasm of uncertain behavior of trachea, bronchus and lung: Secondary | ICD-10-CM

## 2018-12-24 DIAGNOSIS — R911 Solitary pulmonary nodule: Secondary | ICD-10-CM

## 2018-12-31 ENCOUNTER — Telehealth (HOSPITAL_COMMUNITY): Payer: Self-pay

## 2018-12-31 NOTE — Telephone Encounter (Signed)
Encounter complete. 

## 2019-01-05 ENCOUNTER — Other Ambulatory Visit: Payer: Self-pay

## 2019-01-05 ENCOUNTER — Ambulatory Visit (HOSPITAL_COMMUNITY)
Admission: RE | Admit: 2019-01-05 | Discharge: 2019-01-05 | Disposition: A | Discharge status: Home / Self Care | Payer: PRIVATE HEALTH INSURANCE | Source: Ambulatory Visit | Attending: Cardiovascular Disease | Admitting: Cardiovascular Disease

## 2019-01-05 DIAGNOSIS — Z87891 Personal history of nicotine dependence: Secondary | ICD-10-CM | POA: Diagnosis not present

## 2019-01-05 DIAGNOSIS — K219 Gastro-esophageal reflux disease without esophagitis: Secondary | ICD-10-CM | POA: Diagnosis not present

## 2019-01-05 DIAGNOSIS — J439 Emphysema, unspecified: Secondary | ICD-10-CM | POA: Insufficient documentation

## 2019-01-05 DIAGNOSIS — I252 Old myocardial infarction: Secondary | ICD-10-CM | POA: Diagnosis not present

## 2019-01-05 DIAGNOSIS — Z6841 Body Mass Index (BMI) 40.0 and over, adult: Secondary | ICD-10-CM | POA: Insufficient documentation

## 2019-01-05 DIAGNOSIS — R079 Chest pain, unspecified: Secondary | ICD-10-CM | POA: Diagnosis not present

## 2019-01-05 DIAGNOSIS — D381 Neoplasm of uncertain behavior of trachea, bronchus and lung: Secondary | ICD-10-CM | POA: Insufficient documentation

## 2019-01-05 DIAGNOSIS — E669 Obesity, unspecified: Secondary | ICD-10-CM | POA: Diagnosis not present

## 2019-01-05 DIAGNOSIS — I10 Essential (primary) hypertension: Secondary | ICD-10-CM | POA: Diagnosis not present

## 2019-01-05 DIAGNOSIS — Z01810 Encounter for preprocedural cardiovascular examination: Secondary | ICD-10-CM | POA: Diagnosis not present

## 2019-01-05 MED ORDER — TECHNETIUM TC 99M TETROFOSMIN IV KIT
31.5000 | PACK | Freq: Once | INTRAVENOUS | Status: AC | PRN
  Administered 2019-01-05: 13:00:00 31.5 via INTRAVENOUS
  Filled 2019-01-05: qty 32

## 2019-01-05 MED ORDER — REGADENOSON 0.4 MG/5ML IV SOLN
0.4000 mg | Freq: Once | INTRAVENOUS | Status: AC
  Administered 2019-01-05: 0.4 mg via INTRAVENOUS

## 2019-01-06 ENCOUNTER — Ambulatory Visit (HOSPITAL_COMMUNITY)
Admission: RE | Admit: 2019-01-06 | Discharge: 2019-01-06 | Disposition: A | Discharge status: Home / Self Care | Payer: PRIVATE HEALTH INSURANCE | Source: Ambulatory Visit | Attending: Cardiology | Admitting: Cardiology

## 2019-01-06 LAB — MYOCARDIAL PERFUSION IMAGING
LV dias vol: 97 mL (ref 46–106)
LV sys vol: 38 mL
Peak HR: 113 {beats}/min
Rest HR: 78 {beats}/min
SDS: 1
SRS: 1
SSS: 2
TID: 0.94

## 2019-01-06 MED ORDER — TECHNETIUM TC 99M TETROFOSMIN IV KIT
30.6000 | PACK | Freq: Once | INTRAVENOUS | Status: AC | PRN
  Administered 2019-01-06: 30.6 via INTRAVENOUS

## 2019-01-06 NOTE — Progress Notes (Signed)
[id number] - [place], [place] [place] [address] Phone: [phone] Fax: [phone]      Your procedure is scheduled on January 11, 2019.  Report to [facility] Main Entrance "A" at 5:30 A.M., and check in at the Admitting office.  Call this number if you have problems the morning of surgery:  [phone]  Call [phone] if you have any questions prior to your surgery date Monday-Friday 8am-4pm    Remember:  Do not eat or drink after midnight the night before your surgery    Take these medicines the morning of surgery with A SIP OF WATER:  traMADol (ULTRAM)- as needed for pain esomeprazole ([place])- if needed   WHAT DO I DO ABOUT MY DIABETES MEDICATION?   Marland Kitchen Do not take oral diabetes medicines (pills) the morning of surgery.   How to Manage Your Diabetes Before and After Surgery  Why is it important to control my blood sugar before and after surgery? . Improving blood sugar levels before and after surgery helps healing and can limit problems. . A way of improving blood sugar control is eating a healthy diet by: o  Eating less sugar and carbohydrates o  Increasing activity/exercise o  Talking with your doctor about reaching your blood sugar goals . High blood sugars (greater than 180 mg/dL) can raise your risk of infections and slow your recovery, so you will need to focus on controlling your diabetes during the weeks before surgery. . Make sure that the doctor who takes care of your diabetes knows about your planned surgery including the date and location.  How do I manage my blood sugar before surgery? . Check your blood sugar at least 4 times a day, starting 2 days before surgery, to make sure that the level is not too high or low. o Check your blood sugar the morning of your surgery when you wake up and every 2 hours until you get to the Short Stay unit. . If your  blood sugar is less than 70 mg/dL, you will need to treat for low blood sugar: o Do not take insulin. o Treat a low blood sugar (less than 70 mg/dL) with  cup of clear juice (cranberry or apple), 4 glucose tablets, OR glucose gel. o Recheck blood sugar in 15 minutes after treatment (to make sure it is greater than 70 mg/dL). If your blood sugar is not greater than 70 mg/dL on recheck, call [phone] for further instructions. . Report your blood sugar to the short stay nurse when you get to Short Stay.  . If you are admitted to the hospital after surgery: o Your blood sugar will be checked by the staff and you will probably be given insulin after surgery (instead of oral diabetes medicines) to make sure you have good blood sugar levels. o The goal for blood sugar control after surgery is 80-180 mg/dL.       7 days prior to surgery STOP taking any Aspirin (unless otherwise instructed by your surgeon), Aleve, Naproxen, Ibuprofen, Motrin, Advil, Goody's, BC's, all herbal medications, fish oil, and all vitamins.    The Morning of Surgery  Do not wear jewelry, make-up or nail polish.  Do not wear lotions, powders, or perfumes/colognes, or deodorant  Do not shave 48 hours prior to surgery.  Men may shave face and neck.  Do not bring valuables to the hospital.  [facility] is not responsible for any belongings or valuables.  If you are a smoker, DO NOT Smoke 24 hours prior to surgery IF you wear a CPAP at night please bring your mask, tubing, and machine the morning of surgery   Remember that you must have someone to transport you home after your surgery, and remain with you for 24 hours if you are discharged the same day.   Contacts, glasses, hearing aids, dentures or bridgework may not be worn into surgery.    Leave your suitcase in the car.  After surgery it may be brought to your room.  For patients admitted to the hospital, discharge time will be determined by your treatment  team.  Patients discharged the day of surgery will not be allowed to drive home.    Special instructions:   [facility]- Preparing For Surgery  Before surgery, you can play an important role. Because skin is not sterile, your skin needs to be as free of germs as possible. You can reduce the number of germs on your skin by washing with CHG (chlorahexidine gluconate) Soap before surgery.  CHG is an antiseptic cleaner which kills germs and bonds with the skin to continue killing germs even after washing.    Oral Hygiene is also important to reduce your risk of infection.  Remember - BRUSH YOUR TEETH THE MORNING OF SURGERY WITH YOUR REGULAR TOOTHPASTE  Please do not use if you have an allergy to CHG or antibacterial soaps. If your skin becomes reddened/irritated stop using the CHG.  Do not shave (including legs and underarms) for at least 48 hours prior to first CHG shower. It is OK to shave your face.  Please follow these instructions carefully.   1. Shower the NIGHT BEFORE SURGERY and the MORNING OF SURGERY with CHG Soap.   2. If you chose to wash your hair, wash your hair first as usual with your normal shampoo.  3. After you shampoo, rinse your hair and body thoroughly to remove the shampoo.  4. Use CHG as you would any other liquid soap. You can apply CHG directly to the skin and wash gently with a scrungie or a clean washcloth.   5. Apply the CHG Soap to your body ONLY FROM THE NECK DOWN.  Do not use on open wounds or open sores. Avoid contact with your eyes, ears, mouth and genitals (private parts). Wash Face and genitals (private parts)  with your normal soap.   6. Wash thoroughly, paying special attention to the area where your surgery will be performed.  7. Thoroughly rinse your body with warm water from the neck down.  8. DO NOT shower/wash with your normal soap after using and rinsing off the CHG Soap.  9. Pat yourself dry with a CLEAN TOWEL.  10. Wear CLEAN PAJAMAS to bed  the night before surgery, wear comfortable clothes the morning of surgery  11. Place CLEAN SHEETS on your bed the night of your first shower and DO NOT SLEEP WITH PETS.    Day of Surgery:  Do not apply any deodorants/lotions. Please shower the morning of surgery with the CHG soap  Please wear clean clothes to the hospital/surgery center.   Remember to brush your teeth WITH YOUR REGULAR TOOTHPASTE.   Please read over the following fact sheets that you were given.

## 2019-01-07 ENCOUNTER — Ambulatory Visit (HOSPITAL_COMMUNITY)
Admission: RE | Admit: 2019-01-07 | Discharge: 2019-01-07 | Disposition: A | Discharge status: Home / Self Care | Payer: PRIVATE HEALTH INSURANCE | Source: Ambulatory Visit | Attending: Thoracic Surgery (Cardiothoracic Vascular Surgery) | Admitting: Thoracic Surgery (Cardiothoracic Vascular Surgery)

## 2019-01-07 ENCOUNTER — Other Ambulatory Visit: Payer: Self-pay

## 2019-01-07 ENCOUNTER — Encounter (HOSPITAL_COMMUNITY): Payer: Self-pay

## 2019-01-07 ENCOUNTER — Other Ambulatory Visit (HOSPITAL_COMMUNITY)
Admission: RE | Admit: 2019-01-07 | Discharge: 2019-01-07 | Disposition: A | Discharge status: Home / Self Care | Payer: PRIVATE HEALTH INSURANCE | Source: Ambulatory Visit

## 2019-01-07 ENCOUNTER — Encounter (HOSPITAL_COMMUNITY)
Admission: RE | Admit: 2019-01-07 | Discharge: 2019-01-07 | Disposition: A | Discharge status: Home / Self Care | Payer: PRIVATE HEALTH INSURANCE | Source: Ambulatory Visit | Attending: Thoracic Surgery (Cardiothoracic Vascular Surgery) | Admitting: Thoracic Surgery (Cardiothoracic Vascular Surgery)

## 2019-01-07 DIAGNOSIS — Z20828 Contact with and (suspected) exposure to other viral communicable diseases: Secondary | ICD-10-CM | POA: Diagnosis not present

## 2019-01-07 DIAGNOSIS — R911 Solitary pulmonary nodule: Secondary | ICD-10-CM | POA: Insufficient documentation

## 2019-01-07 DIAGNOSIS — Z01818 Encounter for other preprocedural examination: Secondary | ICD-10-CM | POA: Diagnosis not present

## 2019-01-07 HISTORY — DX: Fatty (change of) liver, not elsewhere classified: K76.0

## 2019-01-07 LAB — COMPREHENSIVE METABOLIC PANEL
ALT: 31 U/L (ref 0–44)
AST: 20 U/L (ref 15–41)
Albumin: 3.6 g/dL (ref 3.5–5.0)
Alkaline Phosphatase: 78 U/L (ref 38–126)
Anion gap: 9 (ref 5–15)
BUN: 16 mg/dL (ref 6–20)
CO2: 24 mmol/L (ref 22–32)
Calcium: 8.9 mg/dL (ref 8.9–10.3)
Chloride: 105 mmol/L (ref 98–111)
Creatinine, Ser: 0.91 mg/dL (ref 0.44–1.00)
GFR calc Af Amer: >60 mL/min (ref >60)
GFR calc non Af Amer: >60 mL/min (ref >60)
Glucose, Bld: 131 mg/dL — ABNORMAL HIGH (ref 70–99)
Potassium: 4 mmol/L (ref 3.5–5.1)
Sodium: 138 mmol/L (ref 135–145)
Total Bilirubin: 0.5 mg/dL (ref 0.3–1.2)
Total Protein: 6.1 g/dL — ABNORMAL LOW (ref 6.5–8.1)

## 2019-01-07 LAB — CBC
HCT: 41.1 % (ref 36.0–46.0)
Hemoglobin: 13.4 g/dL (ref 12.0–15.0)
MCH: 27.8 pg (ref 26.0–34.0)
MCHC: 32.6 g/dL (ref 30.0–36.0)
MCV: 85.3 fL (ref 80.0–100.0)
Platelets: 240 10*3/uL (ref 150–400)
RBC: 4.82 MIL/uL (ref 3.87–5.11)
RDW: 14.1 % (ref 11.5–15.5)
WBC: 5.6 10*3/uL (ref 4.0–10.5)
nRBC: 0 % (ref 0.0–0.2)

## 2019-01-07 LAB — URINALYSIS, ROUTINE W REFLEX MICROSCOPIC
Bilirubin Urine: NEGATIVE
Glucose, UA: 150 mg/dL — AB
Hgb urine dipstick: NEGATIVE
Ketones, ur: NEGATIVE mg/dL
Leukocytes,Ua: NEGATIVE
Nitrite: NEGATIVE
Protein, ur: NEGATIVE mg/dL
Specific Gravity, Urine: 1.02 (ref 1.005–1.030)
pH: 5 (ref 5.0–8.0)

## 2019-01-07 LAB — ABO/RH: ABO/RH(D): O POS

## 2019-01-07 LAB — TYPE AND SCREEN
ABO/RH(D): O POS
Antibody Screen: NEGATIVE

## 2019-01-07 LAB — PROTIME-INR
INR: 1.1 (ref 0.8–1.2)
Prothrombin Time: 13.9 seconds (ref 11.4–15.2)

## 2019-01-07 LAB — GLUCOSE, CAPILLARY: Glucose-Capillary: 113 mg/dL — ABNORMAL HIGH (ref 70–99)

## 2019-01-07 LAB — HEMOGLOBIN A1C
Hgb A1c MFr Bld: 6.8 % — ABNORMAL HIGH (ref 4.8–5.6)
Mean Plasma Glucose: 148.46 mg/dL

## 2019-01-07 LAB — APTT: aPTT: 32 seconds (ref 24–36)

## 2019-01-07 NOTE — Progress Notes (Signed)
PCP:  Dr. Gar Ponto Cardiologist: denies  EKG:  01/07/19 CXR:  01/07/19 ECHO:  denies Stress Test: 01/05/19 Cardiac Cath:  denies  Fasting Blood Sugar- 130-165 Checks Blood Sugar_1__ times a day  Blood Thinners:  N/A ASA instructions:  N/A  Anesthesia Review:  Yes, Review Stress Test  Patient denies shortness of breath, fever, cough, and chest pain at PAT appointment.  Patient verbalized understanding of instructions provided today at the PAT appointment.  Patient asked to review instructions at home and day of surgery.

## 2019-01-09 LAB — NOVEL CORONAVIRUS, NAA (HOSP ORDER, SEND-OUT TO REF LAB; TAT 18-24 HRS): SARS-CoV-2, NAA: NOT DETECTED

## 2019-01-10 MED ORDER — DEXTROSE 5 % IV SOLN
3.0000 g | INTRAVENOUS | Status: AC
  Administered 2019-01-11: 3 g via INTRAVENOUS
  Filled 2019-01-10: qty 3

## 2019-01-10 MED ORDER — ACETAMINOPHEN 10 MG/ML IV SOLN
INTRAVENOUS | Status: AC
  Filled 2019-01-10: qty 100

## 2019-01-10 NOTE — Anesthesia Preprocedure Evaluation (Addendum)
Anesthesia Evaluation  Patient identified by MRN, date of birth, ID band Patient awake    Reviewed: Allergy & Precautions, NPO status , Patient's Chart, lab work & pertinent test results  History of Anesthesia Complications Negative for: history of anesthetic complications  Airway Mallampati: II  TM Distance: >3 FB Neck ROM: Full    Dental  (+) Dental Advisory Given   Pulmonary COPD, Patient abstained from smoking., former smoker (quit 2011),  Lung nodule 01/07/2019 SARS CoV2 neg   breath sounds clear to auscultation       Cardiovascular hypertension, Pt. on medications (-) angina Rhythm:Regular Rate:Normal  01/05/2019 Stress: EF 55-65%, no ischemia, low risk study   Neuro/Psych negative neurological ROS     GI/Hepatic Neg liver ROS, GERD  Controlled,  Endo/Other  diabetes (glu 154), Oral Hypoglycemic AgentsMorbid obesity  Renal/GU negative Renal ROS     Musculoskeletal   Abdominal (+) + obese,   Peds  Hematology   Anesthesia Other Findings   Reproductive/Obstetrics                          Anesthesia Physical Anesthesia Plan  ASA: III  Anesthesia Plan: General   Post-op Pain Management:    Induction: Intravenous  PONV Risk Score and Plan: 4 or greater and Scopolamine patch - Pre-op, Dexamethasone and Ondansetron  Airway Management Planned: Double Lumen EBT  Additional Equipment: Arterial line and CVP  Intra-op Plan:   Post-operative Plan: Extubation in OR  Informed Consent: I have reviewed the patients History and Physical, chart, labs and discussed the procedure including the risks, benefits and alternatives for the proposed anesthesia with the patient or authorized representative who has indicated his/her understanding and acceptance.     Dental advisory given  Plan Discussed with: CRNA and Surgeon  Anesthesia Plan Comments: (Dr. Kipp Brood ordered preop nuclear stress  that showed no ischemia, low risk.  Nuclear stress 01/05/19:  The left ventricular ejection fraction is normal (55-65%).  Nuclear stress EF: 61%.  There was no ST segment deviation noted during stress.  No T wave inversion was noted during stress.  The study is normal.  This is a low risk study.  No prior study for comparison.)      Anesthesia Quick Evaluation

## 2019-01-11 ENCOUNTER — Other Ambulatory Visit: Payer: Self-pay

## 2019-01-11 ENCOUNTER — Inpatient Hospital Stay (HOSPITAL_COMMUNITY): Payer: PRIVATE HEALTH INSURANCE

## 2019-01-11 ENCOUNTER — Inpatient Hospital Stay (HOSPITAL_COMMUNITY)
Admission: RE | Admit: 2019-01-11 | Discharge: 2019-01-12 | DRG: 167 | Disposition: A | Discharge status: Home / Self Care | Payer: PRIVATE HEALTH INSURANCE | Attending: Thoracic Surgery (Cardiothoracic Vascular Surgery) | Admitting: Thoracic Surgery (Cardiothoracic Vascular Surgery)

## 2019-01-11 ENCOUNTER — Encounter (HOSPITAL_COMMUNITY)
Admission: RE | Disposition: A | Discharge status: Home / Self Care | Payer: Self-pay | Source: Home / Self Care | Attending: Thoracic Surgery (Cardiothoracic Vascular Surgery)

## 2019-01-11 ENCOUNTER — Inpatient Hospital Stay (HOSPITAL_COMMUNITY): Payer: PRIVATE HEALTH INSURANCE | Admitting: Physician Assistant

## 2019-01-11 ENCOUNTER — Inpatient Hospital Stay (HOSPITAL_COMMUNITY): Payer: PRIVATE HEALTH INSURANCE | Admitting: Certified Registered"

## 2019-01-11 DIAGNOSIS — M1990 Unspecified osteoarthritis, unspecified site: Secondary | ICD-10-CM | POA: Diagnosis present

## 2019-01-11 DIAGNOSIS — Z6841 Body Mass Index (BMI) 40.0 and over, adult: Secondary | ICD-10-CM

## 2019-01-11 DIAGNOSIS — E119 Type 2 diabetes mellitus without complications: Secondary | ICD-10-CM | POA: Diagnosis present

## 2019-01-11 DIAGNOSIS — Z803 Family history of malignant neoplasm of breast: Secondary | ICD-10-CM

## 2019-01-11 DIAGNOSIS — Z8249 Family history of ischemic heart disease and other diseases of the circulatory system: Secondary | ICD-10-CM

## 2019-01-11 DIAGNOSIS — Z9689 Presence of other specified functional implants: Secondary | ICD-10-CM

## 2019-01-11 DIAGNOSIS — K219 Gastro-esophageal reflux disease without esophagitis: Secondary | ICD-10-CM | POA: Diagnosis present

## 2019-01-11 DIAGNOSIS — Z833 Family history of diabetes mellitus: Secondary | ICD-10-CM

## 2019-01-11 DIAGNOSIS — Z87891 Personal history of nicotine dependence: Secondary | ICD-10-CM

## 2019-01-11 DIAGNOSIS — K589 Irritable bowel syndrome without diarrhea: Secondary | ICD-10-CM | POA: Diagnosis present

## 2019-01-11 DIAGNOSIS — R911 Solitary pulmonary nodule: Secondary | ICD-10-CM

## 2019-01-11 DIAGNOSIS — Z8349 Family history of other endocrine, nutritional and metabolic diseases: Secondary | ICD-10-CM

## 2019-01-11 DIAGNOSIS — I10 Essential (primary) hypertension: Secondary | ICD-10-CM | POA: Diagnosis present

## 2019-01-11 DIAGNOSIS — Z7984 Long term (current) use of oral hypoglycemic drugs: Secondary | ICD-10-CM

## 2019-01-11 DIAGNOSIS — Z9889 Other specified postprocedural states: Secondary | ICD-10-CM

## 2019-01-11 HISTORY — PX: VIDEO ASSISTED THORACOSCOPY (VATS)/WEDGE RESECTION: SHX6174

## 2019-01-11 HISTORY — PX: LOBECTOMY: SHX5089

## 2019-01-11 HISTORY — PX: VIDEO BRONCHOSCOPY: SHX5072

## 2019-01-11 LAB — GLUCOSE, CAPILLARY
Glucose-Capillary: 154 mg/dL — ABNORMAL HIGH (ref 70–99)
Glucose-Capillary: 177 mg/dL — ABNORMAL HIGH (ref 70–99)
Glucose-Capillary: 196 mg/dL — ABNORMAL HIGH (ref 70–99)
Glucose-Capillary: 167 mg/dL — ABNORMAL HIGH (ref 70–99)

## 2019-01-11 LAB — POCT I-STAT 7, (LYTES, BLD GAS, ICA,H+H)
Acid-Base Excess: 1 mmol/L (ref 0.0–2.0)
Bicarbonate: 27.6 mmol/L (ref 20.0–28.0)
Calcium, Ion: 1.16 mmol/L (ref 1.15–1.40)
HCT: 36 % (ref 36.0–46.0)
Hemoglobin: 12.2 g/dL (ref 12.0–15.0)
O2 Saturation: 100 %
Potassium: 4.1 mmol/L (ref 3.5–5.1)
Sodium: 136 mmol/L (ref 135–145)
TCO2: 29 mmol/L (ref 22–32)
pCO2 arterial: 49.4 mmHg — ABNORMAL HIGH (ref 32.0–48.0)
pH, Arterial: 7.355 (ref 7.350–7.450)
pO2, Arterial: 397 mmHg — ABNORMAL HIGH (ref 83.0–108.0)

## 2019-01-11 LAB — POCT PREGNANCY, URINE: Preg Test, Ur: NEGATIVE

## 2019-01-11 SURGERY — BRONCHOSCOPY, VIDEO-ASSISTED
Anesthesia: General | Site: Chest

## 2019-01-11 MED ORDER — SENNOSIDES-DOCUSATE SODIUM 8.6-50 MG PO TABS: 1.0000 | ORAL_TABLET | Freq: Every day | ORAL | Status: DC

## 2019-01-11 MED ORDER — ONDANSETRON HCL 4 MG/2ML IJ SOLN: 4.0000 mg | Freq: Four times a day (QID) | INTRAMUSCULAR | Status: DC | PRN

## 2019-01-11 MED ORDER — KETOROLAC TROMETHAMINE 30 MG/ML IJ SOLN
30.0000 mg | Freq: Four times a day (QID) | INTRAMUSCULAR | Status: DC | PRN
  Filled 2019-01-11: qty 2

## 2019-01-11 MED ORDER — DEXAMETHASONE SODIUM PHOSPHATE 10 MG/ML IJ SOLN
INTRAMUSCULAR | Status: DC | PRN
  Administered 2019-01-11: 5 mg via INTRAVENOUS

## 2019-01-11 MED ORDER — ROCURONIUM BROMIDE 10 MG/ML (PF) SYRINGE
PREFILLED_SYRINGE | INTRAVENOUS | Status: AC
  Filled 2019-01-11: qty 10

## 2019-01-11 MED ORDER — SCOPOLAMINE 1 MG/3DAYS TD PT72
1.0000 | MEDICATED_PATCH | Freq: Once | TRANSDERMAL | Status: DC
  Administered 2019-01-11: 06:00:00 1.5 mg via TRANSDERMAL
  Filled 2019-01-11: qty 1

## 2019-01-11 MED ORDER — BUPIVACAINE HCL 0.5 % IJ SOLN
INTRAMUSCULAR | Status: DC | PRN
  Administered 2019-01-11: 30 mL

## 2019-01-11 MED ORDER — KETOROLAC TROMETHAMINE 30 MG/ML IJ SOLN
INTRAMUSCULAR | Status: DC | PRN
  Administered 2019-01-11: 30 mg via INTRAVENOUS

## 2019-01-11 MED ORDER — SUGAMMADEX SODIUM 200 MG/2ML IV SOLN
INTRAVENOUS | Status: DC | PRN
  Administered 2019-01-11: 350 mg via INTRAVENOUS

## 2019-01-11 MED ORDER — MIDAZOLAM HCL 5 MG/5ML IJ SOLN
INTRAMUSCULAR | Status: DC | PRN
  Administered 2019-01-11: 1 mg via INTRAVENOUS

## 2019-01-11 MED ORDER — TRAMADOL HCL 50 MG PO TABS
ORAL_TABLET | ORAL | Status: AC
  Administered 2019-01-12: 12:00:00 50 mg via ORAL
  Filled 2019-01-11: qty 1

## 2019-01-11 MED ORDER — SODIUM CHLORIDE 0.9 % IV SOLN: INTRAVENOUS | Status: DC | PRN

## 2019-01-11 MED ORDER — HYDROMORPHONE HCL 1 MG/ML IJ SOLN
0.2500 mg | INTRAMUSCULAR | Status: DC | PRN
  Administered 2019-01-11 (×2): 0.5 mg via INTRAVENOUS

## 2019-01-11 MED ORDER — INSULIN ASPART 100 UNIT/ML ~~LOC~~ SOLN
0.0000 [IU] | SUBCUTANEOUS | Status: DC
  Administered 2019-01-11 (×2): 4 [IU] via SUBCUTANEOUS
  Administered 2019-01-12: 12:00:00 8 [IU] via SUBCUTANEOUS
  Administered 2019-01-12 (×2): 2 [IU] via SUBCUTANEOUS

## 2019-01-11 MED ORDER — DEXAMETHASONE SODIUM PHOSPHATE 10 MG/ML IJ SOLN
INTRAMUSCULAR | Status: AC
  Filled 2019-01-11: qty 1

## 2019-01-11 MED ORDER — FENTANYL CITRATE (PF) 250 MCG/5ML IJ SOLN
INTRAMUSCULAR | Status: AC
  Filled 2019-01-11: qty 5

## 2019-01-11 MED ORDER — BUPIVACAINE LIPOSOME 1.3 % IJ SUSP
20.0000 mL | Freq: Once | INTRAMUSCULAR | Status: DC
  Filled 2019-01-11: qty 20

## 2019-01-11 MED ORDER — SUCCINYLCHOLINE CHLORIDE 200 MG/10ML IV SOSY
PREFILLED_SYRINGE | INTRAVENOUS | Status: AC
  Filled 2019-01-11: qty 10

## 2019-01-11 MED ORDER — 0.9 % SODIUM CHLORIDE (POUR BTL) OPTIME
TOPICAL | Status: DC | PRN
  Administered 2019-01-11: 09:00:00 2000 mL

## 2019-01-11 MED ORDER — ARTIFICIAL TEARS OPHTHALMIC OINT
TOPICAL_OINTMENT | OPHTHALMIC | Status: AC
  Filled 2019-01-11: qty 3.5

## 2019-01-11 MED ORDER — TRAMADOL HCL 50 MG PO TABS
50.0000 mg | ORAL_TABLET | Freq: Four times a day (QID) | ORAL | Status: DC | PRN
  Administered 2019-01-11 – 2019-01-12 (×2): 50 mg via ORAL
  Filled 2019-01-11: qty 1

## 2019-01-11 MED ORDER — ACETAMINOPHEN 500 MG PO TABS
1000.0000 mg | ORAL_TABLET | Freq: Four times a day (QID) | ORAL | Status: DC
  Administered 2019-01-11 – 2019-01-12 (×4): 1000 mg via ORAL
  Filled 2019-01-11 (×4): qty 2

## 2019-01-11 MED ORDER — MIDAZOLAM HCL 2 MG/2ML IJ SOLN: 0.5000 mg | Freq: Once | INTRAMUSCULAR | Status: DC | PRN

## 2019-01-11 MED ORDER — BUPIVACAINE LIPOSOME 1.3 % IJ SUSP
INTRAMUSCULAR | Status: DC | PRN
  Administered 2019-01-11: 20 mL

## 2019-01-11 MED ORDER — ONDANSETRON HCL 4 MG/2ML IJ SOLN
INTRAMUSCULAR | Status: AC
  Filled 2019-01-11: qty 2

## 2019-01-11 MED ORDER — HYDROMORPHONE HCL 1 MG/ML IJ SOLN
INTRAMUSCULAR | Status: AC
  Filled 2019-01-11: qty 1

## 2019-01-11 MED ORDER — SODIUM CHLORIDE 0.9 % IV SOLN
INTRAVENOUS | Status: DC | PRN
  Administered 2019-01-11: 25 ug/min via INTRAVENOUS
  Administered 2019-01-11: 35 ug/min via INTRAVENOUS

## 2019-01-11 MED ORDER — LACTATED RINGERS IV SOLN
INTRAVENOUS | Status: DC | PRN
  Administered 2019-01-11: 07:00:00 via INTRAVENOUS

## 2019-01-11 MED ORDER — ONDANSETRON HCL 4 MG/2ML IJ SOLN
INTRAMUSCULAR | Status: DC | PRN
  Administered 2019-01-11: 4 mg via INTRAVENOUS

## 2019-01-11 MED ORDER — PROPOFOL 10 MG/ML IV BOLUS
INTRAVENOUS | Status: AC
  Filled 2019-01-11: qty 20

## 2019-01-11 MED ORDER — SODIUM CHLORIDE (PF) 0.9 % IJ SOLN
INTRAMUSCULAR | Status: DC | PRN
  Administered 2019-01-11: 50 mL

## 2019-01-11 MED ORDER — GLYCOPYRROLATE PF 0.2 MG/ML IJ SOSY
PREFILLED_SYRINGE | INTRAMUSCULAR | Status: AC
  Filled 2019-01-11: qty 1

## 2019-01-11 MED ORDER — FENTANYL CITRATE (PF) 100 MCG/2ML IJ SOLN
INTRAMUSCULAR | Status: DC | PRN
  Administered 2019-01-11: 50 ug via INTRAVENOUS
  Administered 2019-01-11: 100 ug via INTRAVENOUS
  Administered 2019-01-11: 150 ug via INTRAVENOUS

## 2019-01-11 MED ORDER — ACETAMINOPHEN 500 MG PO TABS
1000.0000 mg | ORAL_TABLET | Freq: Once | ORAL | Status: AC
  Administered 2019-01-11: 06:00:00 1000 mg via ORAL
  Filled 2019-01-11: qty 2

## 2019-01-11 MED ORDER — ONDANSETRON HCL 4 MG/2ML IJ SOLN
INTRAMUSCULAR | Status: AC
  Administered 2019-01-11: 11:00:00 4 mg
  Filled 2019-01-11: qty 2

## 2019-01-11 MED ORDER — PROPOFOL 10 MG/ML IV BOLUS
INTRAVENOUS | Status: DC | PRN
  Administered 2019-01-11: 150 mg via INTRAVENOUS

## 2019-01-11 MED ORDER — PROMETHAZINE HCL 25 MG/ML IJ SOLN: 6.2500 mg | INTRAMUSCULAR | Status: DC | PRN

## 2019-01-11 MED ORDER — MIDAZOLAM HCL 2 MG/2ML IJ SOLN
INTRAMUSCULAR | Status: AC
  Filled 2019-01-11: qty 4

## 2019-01-11 MED ORDER — CHLORHEXIDINE GLUCONATE CLOTH 2 % EX PADS
6.0000 | MEDICATED_PAD | Freq: Every day | CUTANEOUS | Status: DC
  Administered 2019-01-12 (×2): 6 via TOPICAL

## 2019-01-11 MED ORDER — MEPERIDINE HCL 25 MG/ML IJ SOLN: 6.2500 mg | INTRAMUSCULAR | Status: DC | PRN

## 2019-01-11 MED ORDER — BISACODYL 5 MG PO TBEC
10.0000 mg | DELAYED_RELEASE_TABLET | Freq: Every day | ORAL | Status: DC
  Administered 2019-01-12: 09:00:00 10 mg via ORAL
  Filled 2019-01-11: qty 2

## 2019-01-11 MED ORDER — ENOXAPARIN SODIUM 40 MG/0.4ML ~~LOC~~ SOLN
40.0000 mg | Freq: Every day | SUBCUTANEOUS | Status: DC
  Administered 2019-01-12: 40 mg via SUBCUTANEOUS
  Filled 2019-01-11: qty 0.4

## 2019-01-11 MED ORDER — ACETAMINOPHEN 160 MG/5ML PO SOLN: 1000.0000 mg | Freq: Four times a day (QID) | ORAL | Status: DC

## 2019-01-11 MED ORDER — LIDOCAINE 2% (20 MG/ML) 5 ML SYRINGE
INTRAMUSCULAR | Status: AC
  Filled 2019-01-11: qty 5

## 2019-01-11 MED ORDER — PANTOPRAZOLE SODIUM 40 MG PO TBEC
40.0000 mg | DELAYED_RELEASE_TABLET | Freq: Every day | ORAL | Status: DC
  Administered 2019-01-12: 09:00:00 40 mg via ORAL
  Filled 2019-01-11: qty 1

## 2019-01-11 MED ORDER — MORPHINE SULFATE (PF) 2 MG/ML IV SOLN: 2.0000 mg | INTRAVENOUS | Status: DC | PRN

## 2019-01-11 MED ORDER — CEFAZOLIN SODIUM-DEXTROSE 2-4 GM/100ML-% IV SOLN
2.0000 g | Freq: Three times a day (TID) | INTRAVENOUS | Status: AC
  Administered 2019-01-11 (×2): 2 g via INTRAVENOUS
  Filled 2019-01-11 (×2): qty 100

## 2019-01-11 MED ORDER — ROCURONIUM BROMIDE 10 MG/ML (PF) SYRINGE
PREFILLED_SYRINGE | INTRAVENOUS | Status: DC | PRN
  Administered 2019-01-11: 20 mg via INTRAVENOUS
  Administered 2019-01-11: 60 mg via INTRAVENOUS
  Administered 2019-01-11: 40 mg via INTRAVENOUS

## 2019-01-11 MED ORDER — BUPIVACAINE HCL (PF) 0.5 % IJ SOLN
INTRAMUSCULAR | Status: AC
  Filled 2019-01-11: qty 30

## 2019-01-11 SURGICAL SUPPLY — 103 items
APPLIER CLIP 5 13 M/L LIGAMAX5 (MISCELLANEOUS) ×4
APPLIER CLIP ROT 10 11.4 M/L (STAPLE)
BLADE CLIPPER SURG (BLADE) ×2 IMPLANT
CANISTER SUCT 3000ML PPV (MISCELLANEOUS) ×4 IMPLANT
CATH FOLEY 2WAY SLVR  5CC 14FR (CATHETERS) ×2
CATH FOLEY 2WAY SLVR 5CC 14FR (CATHETERS) IMPLANT
CATH ROBINSON RED A/P 14FR (CATHETERS) IMPLANT
CATH THORACIC 28FR (CATHETERS) ×2 IMPLANT
CATH THORACIC 28FR RT ANG (CATHETERS) IMPLANT
CATH THORACIC 36FR (CATHETERS) IMPLANT
CATH THORACIC 36FR RT ANG (CATHETERS) IMPLANT
CATH TROCAR 20FR (CATHETERS) IMPLANT
CLIP APPLIE 5 13 M/L LIGAMAX5 (MISCELLANEOUS) IMPLANT
CLIP APPLIE ROT 10 11.4 M/L (STAPLE) IMPLANT
CLIP VESOCCLUDE MED 6/CT (CLIP) ×4 IMPLANT
CONN ST 1/4X3/8  BEN (MISCELLANEOUS)
CONN ST 1/4X3/8 BEN (MISCELLANEOUS) IMPLANT
CONN Y 3/8X3/8X3/8  BEN (MISCELLANEOUS)
CONN Y 3/8X3/8X3/8 BEN (MISCELLANEOUS) IMPLANT
CONT SPEC 4OZ CLIKSEAL STRL BL (MISCELLANEOUS) ×10 IMPLANT
COVER SURGICAL LIGHT HANDLE (MISCELLANEOUS) ×2 IMPLANT
COVER WAND RF STERILE (DRAPES) ×2 IMPLANT
DEFOGGER SCOPE WARMER CLEARIFY (MISCELLANEOUS) ×2 IMPLANT
DISSECTOR BLUNT TIP ENDO 5MM (MISCELLANEOUS) IMPLANT
DRAIN CHANNEL 28F RND 3/8 FF (WOUND CARE) IMPLANT
DRAIN CHANNEL 32F RND 10.7 FF (WOUND CARE) IMPLANT
DRAPE INCISE IOBAN 66X45 STRL (DRAPES) ×2 IMPLANT
DRAPE WARM FLUID 44X44 (DRAPES) ×4 IMPLANT
ELECT BLADE 6.5 EXT (BLADE) ×4 IMPLANT
ELECT REM PT RETURN 9FT ADLT (ELECTROSURGICAL) ×4
ELECTRODE REM PT RTRN 9FT ADLT (ELECTROSURGICAL) ×2 IMPLANT
GAUZE SPONGE 4X4 12PLY STRL (GAUZE/BANDAGES/DRESSINGS) ×2 IMPLANT
GAUZE SPONGE 4X4 12PLY STRL LF (GAUZE/BANDAGES/DRESSINGS) ×2 IMPLANT
GLOVE BIO SURGEON STRL SZ7 (GLOVE) ×10 IMPLANT
GLOVE BIOGEL M 6.5 STRL (GLOVE) ×6 IMPLANT
GLOVE BIOGEL PI IND STRL 6.5 (GLOVE) IMPLANT
GLOVE BIOGEL PI INDICATOR 6.5 (GLOVE) ×4
GLOVE SURG SS PI 6.0 STRL IVOR (GLOVE) ×4 IMPLANT
GOWN STRL REUS W/ TWL LRG LVL3 (GOWN DISPOSABLE) ×4 IMPLANT
GOWN STRL REUS W/ TWL XL LVL3 (GOWN DISPOSABLE) ×2 IMPLANT
GOWN STRL REUS W/TWL LRG LVL3 (GOWN DISPOSABLE) ×4
GOWN STRL REUS W/TWL XL LVL3 (GOWN DISPOSABLE) ×2
HANDLE STAPLE ENDO GIA SHORT (STAPLE) ×1
HEMOSTAT SURGICEL 2X14 (HEMOSTASIS) IMPLANT
KIT BASIN OR (CUSTOM PROCEDURE TRAY) ×4 IMPLANT
KIT SUCTION CATH 14FR (SUCTIONS) IMPLANT
KIT TURNOVER KIT B (KITS) ×4 IMPLANT
NDL SPNL 18GX3.5 QUINCKE PK (NEEDLE) IMPLANT
NEEDLE 22X1 1/2 (OR ONLY) (NEEDLE) ×4 IMPLANT
NEEDLE SPNL 18GX3.5 QUINCKE PK (NEEDLE) ×4 IMPLANT
NS IRRIG 1000ML POUR BTL (IV SOLUTION) ×12 IMPLANT
PACK CHEST (CUSTOM PROCEDURE TRAY) ×4 IMPLANT
PACK UNIVERSAL I (CUSTOM PROCEDURE TRAY) ×4 IMPLANT
PAD ARMBOARD 7.5X6 YLW CONV (MISCELLANEOUS) ×8 IMPLANT
POUCH ENDO CATCH II 15MM (MISCELLANEOUS) IMPLANT
POUCH SPECIMEN RETRIEVAL 10MM (ENDOMECHANICALS) IMPLANT
RELOAD EGIA 45 MED/THCK PURPLE (STAPLE) ×10 IMPLANT
RETRACTOR WOUND ALXS 19CM XSML (INSTRUMENTS) IMPLANT
RTRCTR WOUND ALEXIS 19CM XSML (INSTRUMENTS) ×4
SCISSORS LAP 5X35 DISP (ENDOMECHANICALS) IMPLANT
SEALANT PROGEL (MISCELLANEOUS) IMPLANT
SEALANT SURG COSEAL 4ML (VASCULAR PRODUCTS) IMPLANT
SEALANT SURG COSEAL 8ML (VASCULAR PRODUCTS) IMPLANT
SEALER LIGASURE MARYLAND 30 (ELECTROSURGICAL) ×4 IMPLANT
SET IRRIG TUBING LAPAROSCOPIC (IRRIGATION / IRRIGATOR) IMPLANT
SOL ANTI FOG 6CC (MISCELLANEOUS) ×2 IMPLANT
SOLUTION ANTI FOG 6CC (MISCELLANEOUS) ×2
SPECIMEN JAR MEDIUM (MISCELLANEOUS) IMPLANT
SPONGE INTESTINAL PEANUT (DISPOSABLE) ×8 IMPLANT
SPONGE TONSIL TAPE 1 RFD (DISPOSABLE) ×6 IMPLANT
STAPLER ENDO GIA 12 SHRT THIN (STAPLE) ×2 IMPLANT
STAPLER ENDO GIA 12MM SHORT (STAPLE) ×3 IMPLANT
STOPCOCK 4 WAY LG BORE MALE ST (IV SETS) ×4 IMPLANT
SUT MNCRL AB 3-0 PS2 18 (SUTURE) IMPLANT
SUT MON AB 2-0 CT1 36 (SUTURE) IMPLANT
SUT PDS AB 1 CTX 36 (SUTURE) IMPLANT
SUT PROLENE 4 0 RB 1 (SUTURE)
SUT PROLENE 4-0 RB1 .5 CRCL 36 (SUTURE) IMPLANT
SUT SILK  1 MH (SUTURE) ×4
SUT SILK 1 MH (SUTURE) IMPLANT
SUT SILK 1 TIES 10X30 (SUTURE) ×4 IMPLANT
SUT SILK 2 0 SH (SUTURE) IMPLANT
SUT SILK 2 0SH CR/8 30 (SUTURE) ×2 IMPLANT
SUT VIC AB 1 CTX 36 (SUTURE)
SUT VIC AB 1 CTX36XBRD ANBCTR (SUTURE) IMPLANT
SUT VIC AB 2-0 CT1 27 (SUTURE) ×4
SUT VIC AB 2-0 CT1 TAPERPNT 27 (SUTURE) ×4 IMPLANT
SUT VIC AB 3-0 SH 27 (SUTURE) ×2
SUT VIC AB 3-0 SH 27X BRD (SUTURE) ×2 IMPLANT
SUT VICRYL 0 UR6 27IN ABS (SUTURE) ×4 IMPLANT
SUT VICRYL 2 TP 1 (SUTURE) IMPLANT
SYR 10ML LL (SYRINGE) ×4 IMPLANT
SYR 30ML LL (SYRINGE) ×4 IMPLANT
SYSTEM SAHARA CHEST DRAIN ATS (WOUND CARE) ×2 IMPLANT
TAPE CLOTH 4X10 WHT NS (GAUZE/BANDAGES/DRESSINGS) ×4 IMPLANT
TAPE CLOTH SURG 4X10 WHT LF (GAUZE/BANDAGES/DRESSINGS) ×2 IMPLANT
TIP APPLICATOR SPRAY EXTEND 16 (VASCULAR PRODUCTS) IMPLANT
TOWEL GREEN STERILE (TOWEL DISPOSABLE) ×4 IMPLANT
TOWEL GREEN STERILE FF (TOWEL DISPOSABLE) ×4 IMPLANT
TRAY FOLEY MTR SLVR 16FR STAT (SET/KITS/TRAYS/PACK) ×4 IMPLANT
TROCAR XCEL BLADELESS 5X75MML (TROCAR) ×4 IMPLANT
TUBING EXTENTION W/L.L. (IV SETS) ×4 IMPLANT
WATER STERILE IRR 1000ML POUR (IV SOLUTION) ×4 IMPLANT

## 2019-01-11 NOTE — Brief Op Note (Signed)
01/11/2019  10:03 AM  PATIENT:  Tanya Galloway  53 y.o. female  PRE-OPERATIVE DIAGNOSIS:  LUL PULMONARY NODULE  POST-OPERATIVE DIAGNOSIS:  LUL PULMONARY NODULE  PROCEDURE:  Procedure(s): VIDEO BRONCHOSCOPY (N/A) VIDEO ASSISTED THORACOSCOPY (VATS)/LEFT UPPER LOBE WEDGE RESECTION (Left) possible LOBECTOMY (Left)  SURGEON:  Surgeon(s) and Role:    * Lightfoot, Lucile Crater, MD - Primary  PHYSICIAN ASSISTANT:   Nicholes Rough, PA-C   ANESTHESIA:   general  EBL:  50 mL   BLOOD ADMINISTERED:none  DRAINS: ONE BLAKE DRAIN   LOCAL MEDICATIONS USED:  BUPIVICAINE   SPECIMEN:  Source of Specimen:  LEFT UPPER LOBE WEDGE  DISPOSITION OF SPECIMEN:  PATHOLOGY  COUNTS:  YES  TOURNIQUET:  * No tourniquets in log *  DICTATION: .Dragon Dictation  PLAN OF CARE: Admit to inpatient   PATIENT DISPOSITION:  PACU - hemodynamically stable.   Delay start of Pharmacological VTE agent (>24hrs) due to surgical blood loss or risk of bleeding: yes

## 2019-01-11 NOTE — Plan of Care (Signed)
  Problem: Education: Goal: Knowledge of General Education information will improve Description: Including pain rating scale, medication(s)/side effects and non-pharmacologic comfort measures Outcome: Progressing   Problem: Health Behavior/Discharge Planning: Goal: Ability to manage health-related needs will improve Outcome: Progressing   Problem: Clinical Measurements: Goal: Ability to maintain clinical measurements within normal limits will improve Outcome: Progressing Goal: Will remain free from infection Outcome: Progressing Goal: Diagnostic test results will improve Outcome: Progressing Goal: Respiratory complications will improve Outcome: Progressing Goal: Cardiovascular complication will be avoided Outcome: Progressing   Problem: Activity: Goal: Risk for activity intolerance will decrease Outcome: Progressing   Problem: Nutrition: Goal: Adequate nutrition will be maintained Outcome: Progressing   Problem: Coping: Goal: Level of anxiety will decrease Outcome: Progressing   Problem: Elimination: Goal: Will not experience complications related to bowel motility Outcome: Progressing Goal: Will not experience complications related to urinary retention Outcome: Progressing  Initiation of Care Plan  Problem: Pain Managment: Goal: General experience of comfort will improve Outcome: Progressing   Problem: Safety: Goal: Ability to remain free from injury will improve Outcome: Progressing   Problem: Skin Integrity: Goal: Risk for impaired skin integrity will decrease Outcome: Progressing   Problem: Clinical Measurements: Goal: Postoperative complications will be avoided or minimized Outcome: Progressing   Problem: Skin Integrity: Goal: Demonstration of wound healing without infection will improve Outcome: Progressing

## 2019-01-11 NOTE — Anesthesia Procedure Notes (Signed)
Procedure Name: Intubation Date/Time: 01/11/2019 7:48 AM Performed by: Cleda Daub, CRNA Pre-anesthesia Checklist: Patient identified, Emergency Drugs available, Suction available and Patient being monitored Patient Re-evaluated:Patient Re-evaluated prior to induction Oxygen Delivery Method: Circle system utilized Preoxygenation: Pre-oxygenation with 100% oxygen Induction Type: IV induction Ventilation: Oral airway inserted - appropriate to patient size, Mask ventilation throughout procedure and Two handed mask ventilation required Laryngoscope Size: Mac and 3 Grade View: Grade I Endobronchial tube: Left, Double lumen EBT and EBT position confirmed by fiberoptic bronchoscope and 37 Fr Number of attempts: 1 Airway Equipment and Method: Stylet and Fiberoptic brochoscope Placement Confirmation: ETT inserted through vocal cords under direct vision,  positive ETCO2 and breath sounds checked- equal and bilateral Tube secured with: Tape Dental Injury: Teeth and Oropharynx as per pre-operative assessment

## 2019-01-11 NOTE — Transfer of Care (Signed)
Immediate Anesthesia Transfer of Care Note  Patient: GAZELLA ANGLIN  Procedure(s) Performed: VIDEO BRONCHOSCOPY (N/A ) VIDEO ASSISTED THORACOSCOPY (VATS)/LEFT UPPER LOBE WEDGE RESECTION (Left Chest) possible LOBECTOMY (Left Chest)  Patient Location: PACU  Anesthesia Type:General  Level of Consciousness: awake, alert , oriented and patient cooperative  Airway & Oxygen Therapy: Patient Spontanous Breathing and Patient connected to face mask oxygen  Post-op Assessment: Report given to RN and Post -op Vital signs reviewed and stable  Post vital signs: Reviewed and stable  Last Vitals:  Vitals Value Taken Time  BP 121/62 01/11/19 1033  Temp    Pulse 91 01/11/19 1038  Resp 24 01/11/19 1038  SpO2 100 % 01/11/19 1038  Vitals shown include unvalidated device data.  Last Pain:  Vitals:   01/11/19 0609  TempSrc: Oral  PainSc: 0-No pain      Patients Stated Pain Goal: 3 (03/88/82 8003)  Complications: No apparent anesthesia complications

## 2019-01-11 NOTE — Anesthesia Procedure Notes (Signed)
Central Venous Catheter Insertion Performed by: Roderic Palau, MD, anesthesiologist Start/End10/09/2018 6:55 AM, 01/11/2019 7:10 AM Patient location: Pre-op. Preanesthetic checklist: patient identified, IV checked, site marked, risks and benefits discussed, surgical consent, monitors and equipment checked, pre-op evaluation, timeout performed and anesthesia consent Position: Trendelenburg Lidocaine 1% used for infiltration and patient sedated Hand hygiene performed , maximum sterile barriers used  and Seldinger technique used Catheter size: 8 Fr Total catheter length 16. Central line was placed.Double lumen Procedure performed using ultrasound guided technique. Ultrasound Notes:anatomy identified, needle tip was noted to be adjacent to the nerve/plexus identified, no ultrasound evidence of intravascular and/or intraneural injection and image(s) printed for medical record Attempts: 1 Following insertion, dressing applied, line sutured and Biopatch. Post procedure assessment: blood return through all ports  Patient tolerated the procedure well with no immediate complications.

## 2019-01-11 NOTE — Progress Notes (Addendum)
Patient dangeled on side of bed for approx 2 hrs prior to start of shift. Patient assisted to lying position. Patient shows concern for her hands swelling. Will continue to monitor. Will make MD aware on rounds in the AM  0430- Patient output via foley for this shift has been 200cc. Bladder scanned 260cc. Patient cont to c/o swelling to hands. No c/o SOB, no difficulty breathing. B/p 107/65 HR 90, patient states that he b/p usually runs in the 130s/80s.  0630- repositioned foley cath, 900cc output.

## 2019-01-11 NOTE — Anesthesia Postprocedure Evaluation (Signed)
Anesthesia Post Note  Patient: NISSA STANNARD  Procedure(s) Performed: VIDEO BRONCHOSCOPY (N/A ) VIDEO ASSISTED THORACOSCOPY (VATS)/LEFT UPPER LOBE WEDGE RESECTION (Left Chest) possible LOBECTOMY (Left Chest)     Patient location during evaluation: PACU Anesthesia Type: General Level of consciousness: sedated, patient cooperative and oriented Pain management: pain level controlled Vital Signs Assessment: post-procedure vital signs reviewed and stable Respiratory status: spontaneous breathing, nonlabored ventilation, respiratory function stable and patient connected to nasal cannula oxygen Cardiovascular status: blood pressure returned to baseline and stable Postop Assessment: no apparent nausea or vomiting Anesthetic complications: no    Last Vitals:  Vitals:   01/11/19 1500 01/11/19 1530  BP: 108/65 111/69  Pulse: 97 99  Resp: 18 17  Temp: 36.7 C   SpO2: 92% 94%    Last Pain:  Vitals:   01/11/19 1141  TempSrc:   PainSc: Asleep                 Lanora Reveron,E. Wanette Robison

## 2019-01-11 NOTE — Anesthesia Procedure Notes (Signed)
Arterial Line Insertion Start/End10/09/2018 6:45 AM, 01/11/2019 7:05 AM Performed by: Cleda Daub, CRNA, CRNA  Preanesthetic checklist: patient identified, IV checked, site marked, risks and benefits discussed, surgical consent, monitors and equipment checked, pre-op evaluation and anesthesia consent Left, radial was placed Catheter size: 20 G Hand hygiene performed , maximum sterile barriers used  and Seldinger technique used Allen's test indicative of satisfactory collateral circulation Attempts: 1 Procedure performed without using ultrasound guided technique. Ultrasound Notes:anatomy identified, needle tip was noted to be adjacent to the nerve/plexus identified and no ultrasound evidence of intravascular and/or intraneural injection Following insertion, dressing applied and Biopatch. Post procedure assessment: normal  Patient tolerated the procedure well with no immediate complications.

## 2019-01-11 NOTE — Op Note (Signed)
      [address]       [place] [address]             [phone]        01/11/2019  Patient:  Ivar Drape Pre-Op Dx: Left upper lobe solitary pulmonary nodule   Post-op Dx:  same Procedure: - Bronchoscopy - Left Video assisted thoracoscopy - Left upper lobe wedge resection - Intercostal nerve block  Surgeon and Role:      * Labib Cwynar, Lucile Crater, MD - Primary    * T. Harriet Pho, PA-C - assisting  Anesthesia  general EBL:  Minimal  Blood Administration: none Specimen:  Left upper lobe wedge resection  Drains: 28 F argyle chest tube in L chest Counts: correct   Indications: 53 year old female with a 2 cm left upper lobe pulmonary nodule with increased SUV uptake. In regards to this T12 spine avidity MRI spine showed that there was no evidence of disease. She has a history of degenerative joint disease this likely corresponds to the area increased avidity. Discussed several options for diagnosis and treatment of this pulmonary nodule CT-guided biopsy and a surgical biopsy. In regards to treatment we discussed the options of surgical resection and SBRT. She has agreed to proceed with surgical biopsy.   Findings: Normal anatomy on bronchoscopy.  Firm nodule noted in the left upper lobe.  It was densely adherent to the anterior lateral chest wall.  Operative Technique: After the risks, benefits and alternatives were thoroughly discussed, the patient was brought to the operative theatre.  Anesthesia was induced, and the bronchoscope was passed through the endotracheal tube.  All segmental bronchi were visualized.  The endotracheal tube was then exchanged for a double lumen tube.  The patient was then placed in a lateral decubitus position and was prepped and draped in normal sterile fashion.  An appropriate surgical pause was performed, and pre-operative antibiotics were dosed accordingly.  We began with 3cm incision in the anterior axillary line at the 8th  intercostal space.  The chest was entered, and we then placed a 1cm incision at the 10th intercostal space, and introduced our camera port.  The lung was directly visualized.  The left upper lobe was densely adherent to the anterolateral chest wall.  The nodule was palpable in this area.  We performed a generous wedge resection of the left upper lobe.  The wedge resection was then mobilized off of the chest wall in an extra-pleural fashion.  The edges were then marked with clip.  The wedge was sent off to pathology.  While we waited to the results, we then made a 4cm incision in the 2nd intercostal space, and entered the chest under direct visualization.  The lung was then retracted superiorly, and the inferior pulmonary ligament was divided.  The hilum was mobilized anteriorly and posteriorly.   The results were negative for malignancy. So we concluded our mobilization without injury to any critical structures.  An intercostal nerve block was performed under direct visualization.  A 33F chest with then placed, and we watch the remaining lobes re-expand.  The skin and soft tissue were closed with absorbable suture    The patient tolerated the procedure without any immediate complications, and was transferred to the PACU in stable condition.  Ryann Pauli Bary Leriche

## 2019-01-11 NOTE — H&P (Signed)
[address]       [place] [address]             [phone]                           No changes since her clinic appointment Stress test low risk  OR today for bronchoscopy, left thoracoscopy, left upper lobe wedge, and possible lobectomy.  Per My last note:                          [place] Record #[id number] Date of Birth: 05-21-1965  Referring: Derek Jack, MD Primary Care: Caryl Bis, MD Primary Cardiologist: No primary care provider on file.  Chief Complaint:   No chief complaint on file.   History of Present Illness:    ELZINA DEVERA 53 y.o. female for surgical evaluation of the left upper artery.  States that couple months ago she started developing some left chest pain that wax and wane and that she originally thought it was from.  She underwent further evaluation and cross-sectional imaging revealed a 2 cm left upper lobe.  PET/CT did show some avidity in the node.  She denies any neurologic symptoms and her weight has been stable.  Occasionally has some shortness of breath with strenuous activity.  Does admit to having intermittent left chest pain under breast.    Smoking Hx: She quit smoking 9 years ago, smoked 1 to 2 packs/day for 20 years.   Zubrod Score: At the time of surgery this patient's most appropriate activity status/level should be described as: [x] ?    0    Normal activity, no symptoms [] ?    1    Restricted in physical strenuous activity but ambulatory, able to do out light work [] ?    2    Ambulatory and capable of self care, unable to do work activities, up and about               >50 % of waking hours                              [] ?    3    Only limited self care, in bed greater than 50% of waking hours [] ?    4    Completely disabled, no self care, confined to bed or chair [] ?    5    Moribund       Past Medical History:  Diagnosis Date  . Diabetes mellitus without  complication ([place])   . Diverticulosis   . GERD (gastroesophageal reflux disease)   . H/O seasonal allergies   . Hemorrhoids   . Hypertension   . Irritable bowel syndrome          Past Surgical History:  Procedure Laterality Date  . COLONOSCOPY N/A 03/11/2018   Procedure: COLONOSCOPY;  Surgeon: Rogene Houston, MD;  [facility];  Service: Endoscopy;  Laterality: N/A;  930  . KNEE ARTHROSCOPY  2011   left knee  . POLYPECTOMY  03/11/2018   Procedure: POLYPECTOMY;  Surgeon: Rogene Houston, MD;  Location: [facility];  Service: Endoscopy;;  splenic flexure (CSx1), transverse colon (CBx1), hepatic flexure (CSx1), descending colon (CSx1)distal sigmoid colon(CSx3)         Family History  Problem Relation Age  of Onset  . Diabetes Mother   . Thyroid disease Mother   . Hypertension Mother   . Hypertension Father   . Hypertension Brother   . Healthy Brother   . Breast cancer Maternal Grandmother   . Colon cancer Neg Hx      Social History       Tobacco Use  Smoking Status Former Smoker  . Types: Cigarettes  . Quit date: 09/15/2009  . Years since quitting: 9.2  Smokeless Tobacco Never Used  Tobacco Comment   Patient smoked for 25 years, 1 1/2 ppd    Social History       Substance and Sexual Activity  Alcohol Use Yes   Comment: Twice a Year         Allergies  Allergen Reactions  . Sulfa Antibiotics Nausea Only and Swelling          Current Outpatient Medications  Medication Sig Dispense Refill  . dicyclomine (BENTYL) 10 MG capsule Take 1 capsule (10 mg total) by mouth 2 (two) times daily before a meal. 180 capsule 3  . esomeprazole (NEXIUM) 20 MG capsule Take 20 mg by mouth as needed.     . Ibuprofen 200 MG CAPS Take 200 mg by mouth as needed.    Marland Kitchen lisinopril-hydrochlorothiazide (PRINZIDE,ZESTORETIC) 20-12.5 MG per tablet Take 1 tablet by mouth daily.    . metFORMIN (GLUCOPHAGE-XR) 500 MG 24 hr tablet Take  500-1,000 mg by mouth See admin instructions. Take 1 tablet (500 mg) in the morning & 2 tablets (1000 mg) by mouth in the evening.  2  . traMADol (ULTRAM) 50 MG tablet Take 50 mg by mouth daily as needed (pain.). For back pain      No current facility-administered medications for this visit.     Review of Systems  Constitutional: Negative for chills, diaphoresis, fever, malaise/fatigue and weight loss.  HENT: Negative.   Eyes: Negative.   Respiratory: Negative for cough, sputum production and shortness of breath.   Cardiovascular: Negative.   Gastrointestinal: Positive for diarrhea.  Musculoskeletal: Positive for back pain and myalgias.  Skin: Negative.   Neurological: Negative.      PHYSICAL EXAMINATION: There were no vitals taken for this visit. Physical Exam  Constitutional: She is oriented to person, place, and time. She appears well-developed and well-nourished. No distress.  HENT:  Head: Normocephalic and atraumatic.  Eyes: Conjunctivae and EOM are normal. Right eye exhibits no discharge. Left eye exhibits no discharge.  Neck: Normal range of motion. Neck supple. No tracheal deviation present.  Cardiovascular: Normal rate, regular rhythm and normal heart sounds.  No murmur heard. Respiratory: Effort normal. No respiratory distress.  GI: She exhibits no distension.  Musculoskeletal: Normal range of motion.        General: No edema.  Neurological: She is alert and oriented to person, place, and time.  Skin: Skin is warm and dry. She is not diaphoretic.    Diagnostic Studies & Laboratory data:     Recent Radiology Findings:    Imaging Results  Nm Pet Image Initial (pi) Skull Base To Thigh  Result Date: 11/23/2018 CLINICAL DATA:  Initial treatment strategy for lung mass. Chest CT lung abnormality. EXAM: NUCLEAR MEDICINE PET SKULL BASE TO THIGH TECHNIQUE: Fifteen mCi F-18 FDG was injected intravenously. Full-ring PET imaging was performed from the skull base to  thigh after the radiotracer. CT data was obtained and used for attenuation correction and anatomic localization. Fasting blood glucose: 98 mg/dl COMPARISON:  CT 11/10/2018 FINDINGS:  Mediastinal blood pool activity: SUV max 4.1 Liver activity: SUV max NA NECK: No hypermetabolic lymph nodes in the neck. Incidental CT findings: none CHEST: The pulmonary nodule concern in the LEFT upper lobe measures maximum diameter 1.7 cm on image 26/8. This nodule does have associated moderate metabolic activity SUV max equal 4.2. Nodules not changed in size from CT 11/10/2018 No additional suspicious or metabolic lymph nodes pulmonary nodules in the LEFT or RIGHT lung. No hypermetabolic mediastinal lymph nodes or enlarged mediastinal lymph nodes. Incidental CT findings: None ABDOMEN/PELVIS: No abnormal hypermetabolic activity within the liver, pancreas, adrenal glands, or spleen. No hypermetabolic lymph nodes in the abdomen or pelvis. Incidental CT findings: Hepatic steatosis. Normal adrenal glands. Uterus and adnexa normal. SKELETON: Single focus of metabolic activity within the mid vertebral body at T12 vertebral body level. No associated CT findings. Activity is moderate SUV max equal 5.6. Incidental CT findings: None IMPRESSION: 1. Hypermetabolic nodule in the LEFT upper lobe not resolved over 2 week interval. Findings concerning for bronchogenic carcinoma. Consider tissue sampling versus short-term CT follow-up (1 month) to exclude pulmonary infection. 2. No hypermetabolic or enlarged mediastinal lymph nodes. 3. Single focus of metabolic activity in the C58 vertebral body is favored NON malignant. These results will be called to the ordering clinician or representative by the Radiologist Assistant, and communication documented in the PACS or zVision Dashboard. Electronically Signed   By: Suzy Bouchard M.D.   On: 11/23/2018 12:08        I have independently reviewed the above radiology studies  and reviewed the findings  with the patient.   Recent Lab Findings: Recent Labs       Lab Results  Component Value Date   HGB 14.3 01/04/2010   HCT 42.0 01/04/2010   GLUCOSE 146 (H) 11/10/2018   NA 139 11/10/2018   K 4.6 11/10/2018   CL 100 11/10/2018   CREATININE 0.80 11/10/2018   BUN 13 11/10/2018   CO2 28 11/10/2018       PFTs: - FVC: 86% - FEV1: 80% -DLCO: 125%  Problem List: 2cm LUL pulmonary nodule SUV uptake of 4.2 T12 avidity with SUV of 5.6  Assessment / Plan:   14-year-old female with a 2 cm left upper lobe pulmonary nodule with increased SUV uptake.  In regards to this T12 spine avidity MRI spine showed that there was no evidence of disease.  She has a history of degenerative joint disease this likely corresponds to the area increased avidity.  Discussed several options for diagnosis and treatment of this pulmonary nodule CT-guided biopsy and a surgical biopsy.  In regards to treatment we discussed the options of surgical resection and SBRT.  She has agreed to proceed with surgical biopsy.  She will require a stress test prior to surgery.  She is tentatively scheduled for early October for a bronchoscopy, left thoracoscopy, left upper lobe wedge, and possible lobectomy.  I  spent 40 minutes with  the patient face to face and greater then 50% of the time was spent in counseling and coordination of care.    Quadasia Newsham Bary Leriche

## 2019-01-12 ENCOUNTER — Inpatient Hospital Stay (HOSPITAL_COMMUNITY): Payer: PRIVATE HEALTH INSURANCE

## 2019-01-12 ENCOUNTER — Encounter (HOSPITAL_COMMUNITY): Payer: Self-pay | Admitting: Thoracic Surgery (Cardiothoracic Vascular Surgery)

## 2019-01-12 LAB — BLOOD GAS, ARTERIAL
Acid-Base Excess: 3.1 mmol/L — ABNORMAL HIGH (ref 0.0–2.0)
Bicarbonate: 27.6 mmol/L (ref 20.0–28.0)
Drawn by: 27027
FIO2: 21
O2 Saturation: 92.9 %
Patient temperature: 98.6
pCO2 arterial: 46 mmHg (ref 32.0–48.0)
pH, Arterial: 7.395 (ref 7.350–7.450)
pO2, Arterial: 66.3 mmHg — ABNORMAL LOW (ref 83.0–108.0)

## 2019-01-12 LAB — GLUCOSE, CAPILLARY
Glucose-Capillary: 155 mg/dL — ABNORMAL HIGH (ref 70–99)
Glucose-Capillary: 205 mg/dL — ABNORMAL HIGH (ref 70–99)

## 2019-01-12 LAB — CBC
HCT: 34.3 % — ABNORMAL LOW (ref 36.0–46.0)
Hemoglobin: 11.4 g/dL — ABNORMAL LOW (ref 12.0–15.0)
MCH: 28.3 pg (ref 26.0–34.0)
MCHC: 33.2 g/dL (ref 30.0–36.0)
MCV: 85.1 fL (ref 80.0–100.0)
Platelets: 211 10*3/uL (ref 150–400)
RBC: 4.03 MIL/uL (ref 3.87–5.11)
RDW: 14.2 % (ref 11.5–15.5)
WBC: 8.1 10*3/uL (ref 4.0–10.5)
nRBC: 0 % (ref 0.0–0.2)

## 2019-01-12 LAB — BASIC METABOLIC PANEL
Anion gap: 9 (ref 5–15)
BUN: 15 mg/dL (ref 6–20)
CO2: 24 mmol/L (ref 22–32)
Calcium: 7.4 mg/dL — ABNORMAL LOW (ref 8.9–10.3)
Chloride: 101 mmol/L (ref 98–111)
Creatinine, Ser: 0.94 mg/dL (ref 0.44–1.00)
GFR calc Af Amer: >60 mL/min (ref >60)
GFR calc non Af Amer: >60 mL/min (ref >60)
Glucose, Bld: 130 mg/dL — ABNORMAL HIGH (ref 70–99)
Potassium: 3.5 mmol/L (ref 3.5–5.1)
Sodium: 134 mmol/L — ABNORMAL LOW (ref 135–145)

## 2019-01-12 LAB — SURGICAL PATHOLOGY

## 2019-01-12 MED ORDER — TRAMADOL HCL 50 MG PO TABS: 50.0000 mg | ORAL_TABLET | Freq: Four times a day (QID) | ORAL | 0 refills | Status: DC | PRN

## 2019-01-12 MED ORDER — ACETAMINOPHEN 500 MG PO TABS: 1000.0000 mg | ORAL_TABLET | Freq: Four times a day (QID) | ORAL | 0 refills | Status: AC

## 2019-01-12 NOTE — Progress Notes (Signed)
      [address]       [place] [address]             [phone]      Dr. Kipp Brood pulled the patient's chest tube this afternoon. No complications. He feels the patient is ready for discharge.   Will provide the patient with a new Tramadol prescription since she is running low.   Follow-up on 10/16 with Dr. Kipp Brood. If she has any questions she can feel free to call out office at [phone].   Nicholes Rough, PA-C

## 2019-01-12 NOTE — Progress Notes (Signed)
Patient c/o swelling to both hands, with tightening felt to left arm. Tessa PA-C contacted presently in surgery , message left with personal that answered her phone .Will await call back or call later.O/E both arms are palpable slight puffiness noted to left arm . Will continue to observe.

## 2019-01-12 NOTE — Discharge Instructions (Signed)
Discharge Instructions:  1. You may shower, please wash incisions daily with soap and water and keep dry.  If you wish to cover wounds with dressing you may do so but please keep clean and change daily.  No tub baths or swimming until incisions have completely healed.  If your incisions become red or develop any drainage please call our office at [phone]  2. No Driving until cleared by Dr. Abran Duke office and you are no longer using narcotic pain medications  3. Monitor your weight daily.. Please use the same scale and weigh at same time... If you gain 3-5 lbs in 48 hours with associated lower extremity swelling, please contact our office at [phone]  4. Fever of 101.5 for at least 24 hours, please contact our office at [phone]  6.  Please take your blood pressure a few times a day.  If you notice that your systolic blood pressure is greater than 140 please call our office.  7. Activity- up as tolerated, please walk at least 3 times per day.  Avoid strenuous activity, no lifting, pushing, or pulling with your arms over 8-10 lbs for a minimum of 6 weeks  8. If any questions or concerns arise, please do not hesitate to contact our office at [phone]

## 2019-01-12 NOTE — Discharge Summary (Addendum)
Physician Discharge Summary        [address]       [place] [address]             [phone]         Patient ID: Tanya Galloway MRN: [id number] DOB/AGE: May 27, 1965 53 y.o.  Admit date: 01/11/2019 Discharge date: 01/12/2019  Admission Diagnoses:  Patient Active Problem List   Diagnosis Date Noted  . S/P thoracotomy 01/11/2019  . Nodule of left lung 11/24/2018  . Special screening for malignant neoplasms, colon 01/05/2018  . GERD (gastroesophageal reflux disease) 12/15/2011  . HTN (hypertension) 09/16/2011  . IBS (irritable bowel syndrome) 09/16/2011  . Obesity 09/16/2011    Discharge Diagnoses:  Active Problems:   S/P thoracotomy   Discharged Condition: good  HPI:   [name] [id number] y.o.femalefor surgical evaluation of the left upper artery. States that couple months ago she started developing some left chest pain that wax and wane and that she originally thought it was from. She underwent further evaluation and cross-sectional imaging revealed a 2 cmleftupper lobe.PET/CT did show some avidity in the node.She denies any neurologic symptoms and her weight has been stable. Occasionally has some shortness of breath with strenuous activity.  Does admit to having intermittentleftchest pain under breast.   Hospital Course:   On 01/11/2019 Ms. Peedin underwent a Left video-assisted thoracoscopy, left upper lobe wedge, and intercostal nerve block with Dr. Kipp Brood. She tolerated the procedure well and was transferred to the stepdown unit for continued care. She was extubated in the OR. POD 1 she was tolerating room air with excellent support. She was encouraged to walk in the halls. Her chest tube was changed to water seal. Her pain was mostly controlled but she was still requiring IV pain medication. POD 2 her chest tube was removed. We were able to get a chest xray which showed: No pneumothorax, no change in left greater than right atelectasis and  likely small left pleural effusion. Today, she is ambulating with limited assistance, tolerating a solid diet, she is on room air with excellent oxygen saturation, her wounds are healing well, and she is ready for discharge home.   Consults: None  Significant Diagnostic Studies:  CLINICAL DATA:  Post thoracotomy.  EXAM: PORTABLE CHEST 1 VIEW  COMPARISON:  01/07/2019  FINDINGS: Left IJ central venous catheter has tip projecting over the superior aspect of the aortic arch as this may be arterial. Left apical chest tube in adequate position. Lungs are hypoinflated with mild opacification over the right midlung and left mid to lower lung likely atelectasis. Surgical clips over the lateral left midlung. No definite pneumothorax visualized. No effusion. Cardiac silhouette within normal. Mild prominence of the main pulmonary artery segment. Remainder of the exam is unchanged.  IMPRESSION: 1. Postsurgical change over the lateral left midlung. Left apical chest tube in place. No pneumothorax. Mild opacification over the left mid to lower lung and right midlung likely atelectasis.  2. Left IJ catheter with tip over the medial aspect of the aortic arch as this may be arterial in nature. Recommend clinical correlation.  These results were called by telephone at the time of interpretation on 01/11/2019 at 12:10 pm to patient's nurse, Ileene Rubens, who verbally acknowledged these results.   Electronically Signed   By: Marin Olp M.D.   On: 01/11/2019 12:11   Treatments:   01/11/2019  Patient:  Tanya Galloway Pre-Op Dx: Left upper lobe solitary pulmonary nodule   Post-op  Dx:  same Procedure: - Bronchoscopy - Left Video assisted thoracoscopy - Left upper lobe wedge resection - Intercostal nerve block  Surgeon and Role:      * Lightfoot, Lucile Crater, MD - Primary    * T. Harriet Pho, PA-C - assisting  Anesthesia  general EBL:  Minimal  Blood Administration: none Specimen:   Left upper lobe wedge resection  Drains: 28 F argyle chest tube in L chest Counts: correct   Indications: 53 year old female with a 2 cm left upper lobe pulmonary nodule with increased SUV uptake. In regards to this T12 spine avidity MRI spine showed that there was no evidence of disease. She has a history of degenerative joint disease this likely corresponds to the area increased avidity. Discussed several options for diagnosis and treatment of this pulmonary nodule CT-guided biopsy and a surgical biopsy. In regards to treatment we discussed the options of surgical resection and SBRT. She has agreed to proceed with surgical biopsy.   Discharge Exam: Blood pressure (!) 112/57, pulse 90, temperature 98.5 F (36.9 C), temperature source Oral, resp. rate 20, height 5\' 9"  (1.753 m), weight 136 kg, last menstrual period 06/06/2018, SpO2 95 %.   General appearance: alert, cooperative and no distress Heart: regular rate and rhythm, S1, S2 normal, no murmur, click, rub or gallop Lungs: clear to auscultation bilaterally Abdomen: soft, non-tender; bowel sounds normal; no masses,  no organomegaly Extremities: 1+ edema in upper extremity Wound: clean and dry  Disposition: Discharge disposition: 01-Home or Self Care        Allergies as of 01/12/2019      Reactions   Sulfa Antibiotics Nausea Only, Swelling   SWELLING REACTION UNSPECIFIED       Medication List    STOP taking these medications   lisinopril-hydrochlorothiazide 20-12.5 MG tablet Commonly known as: ZESTORETIC     TAKE these medications   acetaminophen 500 MG tablet Commonly known as: TYLENOL Take 2 tablets (1,000 mg total) by mouth every 6 (six) hours.   dicyclomine 10 MG capsule Commonly known as: BENTYL Take 1 capsule (10 mg total) by mouth 2 (two) times daily before a meal.   esomeprazole 20 MG capsule Commonly known as: NEXIUM Take 20 mg by mouth daily as needed (heartburn).   ibuprofen 200 MG tablet  Commonly known as: ADVIL Take 200 mg by mouth every 8 (eight) hours as needed for headache or moderate pain.   metFORMIN 500 MG 24 hr tablet Commonly known as: GLUCOPHAGE-XR Take 500-1,000 mg by mouth See admin instructions. Take 1 tablet (500 mg) in the morning & 2 tablets (1000 mg) by mouth in the evening.   traMADol 50 MG tablet Commonly known as: ULTRAM Take 1 tablet (50 mg total) by mouth every 6 (six) hours as needed (mild pain). What changed:   when to take this  reasons to take this  additional instructions      Follow-up Information    Caryl Bis, MD. Call in 1 day(s).   Specialty: Family Medicine Contact information: [place] [address] [phone]        Lajuana Matte, MD Follow up.   Specialty: Thoracic Surgery Why: Your routine follow appointment is on 10/16 @1 :00pm.  Please arrive at 12:30pm for a chest xray which is located at [facility] which is on the first floor of our building. Please bring your hospital discharge paperwork. Contact information: [place] [address] [phone]  Signed: Elgie Collard 01/12/2019, 2:32 PM

## 2019-01-12 NOTE — Progress Notes (Signed)
Pt discharged taken off unit in wheelchgair. Told to call Dr or unit of any issues.

## 2019-01-12 NOTE — Progress Notes (Signed)
Chaplain engaged in initial visit with Tanya Galloway. Tanya Galloway expressed her faith and a deep belief in God.  Tanya Galloway shared that what she thought was a cancer diagnosis turned out to be pneumonia instead, which she counted as her "testimony."  She presented a great deal of gratitude to God for this change in her prognosis.  Chaplain offered spiritual presence and prayer.

## 2019-01-12 NOTE — Progress Notes (Addendum)
      [address]       [place] [address]             [phone]      1 Day Post-Op Procedure(s) (LRB): VIDEO BRONCHOSCOPY (N/A) VIDEO ASSISTED THORACOSCOPY (VATS)/LEFT UPPER LOBE WEDGE RESECTION (Left) possible LOBECTOMY (Left) Subjective: Feels okay. She is having trouble taking multiple deep breaths. Plans to walk in the halls today.   Objective: Vital signs in last 24 hours: Temp:  [97.6 F (36.4 C)-98.2 F (36.8 C)] 98.2 F (36.8 C) (10/07 0816) Pulse Rate:  [82-99] 84 (10/07 0816) Cardiac Rhythm: Normal sinus rhythm (10/07 0753) Resp:  [14-25] 20 (10/07 0816) BP: (101-113)/(57-74) 112/66 (10/07 0816) SpO2:  [92 %-97 %] 94 % (10/07 0816) Arterial Line BP: (115-122)/(57-70) 122/57 (10/06 1141) Weight:  [932 kg] 136 kg (10/06 1607)     Intake/Output from previous day: 10/06 0701 - 10/07 0700 In: 3557 [P.O.:240; I.V.:1250; IV Piggyback:200] Out: 3220 [Urine:1325; Blood:50; Chest Tube:202] Intake/Output this shift: Total I/O In: -  Out: 20 [Chest Tube:20]  General appearance: alert, cooperative and no distress Heart: regular rate and rhythm, S1, S2 normal, no murmur, click, rub or gallop Lungs: clear to auscultation bilaterally Abdomen: soft, non-tender; bowel sounds normal; no masses,  no organomegaly Extremities: 1+ edema in upper extremity Wound: clean and dry  Lab Results: Recent Labs    01/11/19 0822 01/12/19 0350  WBC  --  8.1  HGB 12.2 11.4*  HCT 36.0 34.3*  PLT  --  211   BMET:  Recent Labs    01/11/19 0822 01/12/19 0350  NA 136 134*  K 4.1 3.5  CL  --  101  CO2  --  24  GLUCOSE  --  130*  BUN  --  15  CREATININE  --  0.94  CALCIUM  --  7.4*    PT/INR: No results for input(s): LABPROT, INR in the last 72 hours. ABG    Component Value Date/Time   PHART 7.395 01/12/2019 0400   HCO3 27.6 01/12/2019 0400   TCO2 29 01/11/2019 0822   O2SAT 92.9 01/12/2019 0400   CBG (last 3)  Recent Labs    01/11/19 1639  01/11/19 2121 01/12/19 0606  GLUCAP 196* 167* 155*    Assessment/Plan: S/P Procedure(s) (LRB): VIDEO BRONCHOSCOPY (N/A) VIDEO ASSISTED THORACOSCOPY (VATS)/LEFT UPPER LOBE WEDGE RESECTION (Left) possible LOBECTOMY (Left)  1. Chest tube-chest xray stable compared to yesterday study. Chest tube put out 300cc/24 hours. Keep for now.  2. Pulm-tolerating room air with good oxygen saturation.  3. Renal-creatinine 0.94, electrolytes okay 4. Endo-blood glucose well controlled  Plan: Chest tube to waterseal.  Possible removal and discharge tomorrow. Ambulate in the halls today. Advance diet as tolerated. Will order IS since patient does not have one.     LOS: 1 day    Tanya Galloway 01/12/2019

## 2019-01-14 ENCOUNTER — Telehealth: Payer: Self-pay

## 2019-01-14 NOTE — Telephone Encounter (Signed)
Tanya Galloway called c/o sinus congestion and drainage. Pain with coughing.in chest area. She denies any fevers for SOB No problems with incision site. I recommended that she take Mucinex and antihistamines. daily If sx's continue or get worse to F/U with her PCP

## 2019-01-20 ENCOUNTER — Other Ambulatory Visit: Payer: Self-pay | Admitting: Thoracic Surgery (Cardiothoracic Vascular Surgery)

## 2019-01-20 DIAGNOSIS — R911 Solitary pulmonary nodule: Secondary | ICD-10-CM

## 2019-01-21 ENCOUNTER — Other Ambulatory Visit: Payer: Self-pay

## 2019-01-21 ENCOUNTER — Encounter: Payer: Self-pay | Admitting: Thoracic Surgery (Cardiothoracic Vascular Surgery)

## 2019-01-21 ENCOUNTER — Ambulatory Visit (INDEPENDENT_AMBULATORY_CARE_PROVIDER_SITE_OTHER): Payer: Self-pay | Admitting: Thoracic Surgery (Cardiothoracic Vascular Surgery)

## 2019-01-21 ENCOUNTER — Ambulatory Visit
Admission: RE | Admit: 2019-01-21 | Discharge: 2019-01-21 | Disposition: A | Discharge status: Home / Self Care | Payer: PRIVATE HEALTH INSURANCE | Source: Ambulatory Visit | Attending: Cardiothoracic Surgery | Admitting: Cardiothoracic Surgery

## 2019-01-21 VITALS — BP 126/84 | HR 76 | Temp 97.8°F | Resp 20 | Ht 69.0 in | Wt 309.0 lb

## 2019-01-21 DIAGNOSIS — J90 Pleural effusion, not elsewhere classified: Secondary | ICD-10-CM

## 2019-01-21 DIAGNOSIS — R911 Solitary pulmonary nodule: Secondary | ICD-10-CM

## 2019-01-21 DIAGNOSIS — Z09 Encounter for follow-up examination after completed treatment for conditions other than malignant neoplasm: Secondary | ICD-10-CM

## 2019-01-21 NOTE — Progress Notes (Signed)
      [name]       [place] [address]             [phone]        Tanya Galloway [place] Medical Record #[id number] Date of Birth: 1966-03-10  Referring: Tanya Jack, MD Primary Care: Tanya Bis, MD Primary Cardiologist:No primary care provider on file.  Reason for visit:   follow-up  History of Present Illness:     Tanya Galloway presents today for her first follow-up appointment after undergoing a VATS wedge resection of the pulmonary nodule.  The biopsy did not show any malignancy.  Overall she is doing well, she continues to have some shortness of breath but this is not limiting to her.  Her pain is been well controlled she is only limited to Tylenol.  Physical Exam: BP 126/84   Pulse 76   Temp 97.8 F (36.6 C) (Skin)   Resp 20   Ht 5\' 9"  (1.753 m)   Wt (!) 309 lb (140.2 kg)   SpO2 95% Comment: RA  BMI 45.63 kg/m   Alert NAD RRR, no murmur.  Incision clean, stitch removed.   Abdomen soft, NT/ND no peripheral edema   Diagnostic Studies & Laboratory data:  Path: A. LUNG, LEFT UPPER LOBE, WEDGE RESECTION:  - Benign lung parenchyma with dense inflammation, including  microabscesses.  - There is no evidence of malignancy.  - See comment.     Assessment / Plan:   53 year old female status post resection of the left upper lobe of the pulmonary nodule.  The pathology revealed dense inflammation and microabscesses but no malignancy.  Continue pulmonary toilet Return to clinic as needed   Tanya Galloway 01/21/2019 3:29 PM

## 2019-01-26 ENCOUNTER — Other Ambulatory Visit: Payer: Self-pay

## 2019-01-27 ENCOUNTER — Inpatient Hospital Stay (HOSPITAL_COMMUNITY): Payer: PRIVATE HEALTH INSURANCE | Attending: Hematology | Admitting: Hematology

## 2019-01-27 VITALS — BP 147/74 | HR 84 | Temp 97.1°F | Resp 16 | Wt 312.2 lb

## 2019-01-27 DIAGNOSIS — R911 Solitary pulmonary nodule: Secondary | ICD-10-CM | POA: Diagnosis not present

## 2019-01-27 DIAGNOSIS — I10 Essential (primary) hypertension: Secondary | ICD-10-CM | POA: Diagnosis not present

## 2019-01-27 DIAGNOSIS — J90 Pleural effusion, not elsewhere classified: Secondary | ICD-10-CM

## 2019-01-27 DIAGNOSIS — R0602 Shortness of breath: Secondary | ICD-10-CM | POA: Diagnosis not present

## 2019-01-27 DIAGNOSIS — E119 Type 2 diabetes mellitus without complications: Secondary | ICD-10-CM | POA: Insufficient documentation

## 2019-01-27 NOTE — Progress Notes (Addendum)
[place], [place] [address]   CLINIC:  Medical Oncology/Hematology  PCP:  Caryl Bis, MD [place] [address] [phone]   REASON FOR VISIT:  Follow-up for lung nodule, MRI of the thoracic spine and head.  CURRENT THERAPY: Status post resection of lung nodule.   INTERVAL HISTORY:  Tanya Galloway 53 y.o. female seen for follow-up of lung nodule.  She underwent surgical resection on 01/11/2019.  She has recovered very well from surgery.  Appetite is 100%.  Energy levels are 25%.  No pain is reported.  Shortness of breath on exertion is stable.  Feet swelling is also stable.  She does report sleep problems at times.   REVIEW OF SYSTEMS:  Review of Systems  Respiratory: Positive for shortness of breath.   Cardiovascular: Positive for leg swelling.  Neurological: Negative for numbness.  Psychiatric/Behavioral: Positive for sleep disturbance.  All other systems reviewed and are negative.    PAST MEDICAL/SURGICAL HISTORY:  Past Medical History:  Diagnosis Date  . Diabetes mellitus without complication ([place])   . Diverticulosis   . Fatty liver   . GERD (gastroesophageal reflux disease)   . H/O seasonal allergies   . Hemorrhoids   . Hypertension   . Irritable bowel syndrome    Past Surgical History:  Procedure Laterality Date  . COLONOSCOPY N/A 03/11/2018   Procedure: COLONOSCOPY;  Surgeon: Rogene Houston, MD;  [facility]: [facility];  Service: Endoscopy;  Laterality: N/A;  930  . KNEE ARTHROSCOPY  2011   left knee  . LOBECTOMY Left 01/11/2019   Procedure: possible LOBECTOMY;  Surgeon: Lajuana Matte, MD;  Location: [facility];  Service: Thoracic;  Laterality: Left;  . POLYPECTOMY  03/11/2018   Procedure: POLYPECTOMY;  Surgeon: Rogene Houston, MD;  Location: [facility];  Service: Endoscopy;;  splenic flexure (CSx1), transverse colon (CBx1), hepatic flexure (CSx1), descending colon (CSx1)distal sigmoid colon(CSx3)  .  VIDEO ASSISTED THORACOSCOPY (VATS)/WEDGE RESECTION Left 01/11/2019   Procedure: VIDEO ASSISTED THORACOSCOPY (VATS)/LEFT UPPER LOBE WEDGE RESECTION;  Surgeon: Lajuana Matte, MD;  Location: [facility];  Service: Thoracic;  Laterality: Left;  Marland Kitchen VIDEO BRONCHOSCOPY N/A 01/11/2019   Procedure: VIDEO BRONCHOSCOPY;  Surgeon: Lajuana Matte, MD;  Location: [facility];  Service: Thoracic;  Laterality: N/A;     SOCIAL HISTORY:  Social History   Socioeconomic History  . Marital status: Married    Spouse name: Afsheen Antony.  . Number of children: Not on file  . Years of education: Not on file  . Highest education level: Not on file  Occupational History  . Not on file  Social Needs  . Financial resource strain: Not hard at all  . Food insecurity    Worry: Never true    Inability: Never true  . Transportation needs    Medical: No    Non-medical: No  Tobacco Use  . Smoking status: Former Smoker    Types: Cigarettes    Quit date: 09/15/2009    Years since quitting: 9.3  . Smokeless tobacco: Never Used  . Tobacco comment: Patient smoked for 25 years, 1 1/2 ppd  Substance and Sexual Activity  . Alcohol use: Yes    Comment: Twice a Year  . Drug use: No  . Sexual activity: Not on file  Lifestyle  . Physical activity    Days per week: 1 day    Minutes per session: 10 min  . Stress: Only  a little  Relationships  . Social connections    Talks on phone: More than three times a week    Gets together: More than three times a week    Attends religious service: More than 4 times per year    Active member of club or organization: No    Attends meetings of clubs or organizations: Never    Relationship status: Married  . Intimate partner violence    Fear of current or ex partner: No    Emotionally abused: No    Physically abused: No    Forced sexual activity: No  Other Topics Concern  . Not on file  Social History Narrative  . Not on file    FAMILY HISTORY:  Family History  Problem  Relation Age of Onset  . Diabetes Mother   . Thyroid disease Mother   . Hypertension Mother   . Hypertension Father   . Hypertension Brother   . Healthy Brother   . Breast cancer Maternal Grandmother   . Colon cancer Neg Hx     CURRENT MEDICATIONS:  Outpatient Encounter Medications as of 01/27/2019  Medication Sig  . Lancets (ONETOUCH DELICA PLUS [id number]) MISC USE TO TEST FASTING BLOOD SUGAR D  . metFORMIN (GLUCOPHAGE-XR) 500 MG 24 hr tablet Take 500-1,000 mg by mouth See admin instructions. Take 1 tablet (500 mg) in the morning & 2 tablets (1000 mg) by mouth in the evening.  [name] VERIO test strip USE TO TEST FASTING BLOOD SUGAR ONCE D  . acetaminophen (TYLENOL) 500 MG tablet Take 2 tablets (1,000 mg total) by mouth every 6 (six) hours. (Patient not taking: Reported on 01/27/2019)  . clobetasol (TEMOVATE) 0.05 % external solution   . dicyclomine (BENTYL) 10 MG capsule Take 1 capsule (10 mg total) by mouth 2 (two) times daily before a meal. (Patient not taking: Reported on 01/27/2019)  . esomeprazole (NEXIUM) 20 MG capsule Take 20 mg by mouth daily as needed (heartburn).   Marland Kitchen ibuprofen (ADVIL) 200 MG tablet Take 200 mg by mouth every 8 (eight) hours as needed for headache or moderate pain.   . [DISCONTINUED] traMADol (ULTRAM) 50 MG tablet Take 1 tablet (50 mg total) by mouth every 6 (six) hours as needed (mild pain).   No facility-administered encounter medications on file as of 01/27/2019.     ALLERGIES:  Allergies  Allergen Reactions  . Sulfa Antibiotics Nausea Only and Swelling    SWELLING REACTION UNSPECIFIED      PHYSICAL EXAM:  ECOG Performance status: 0  Vitals:   01/27/19 1537  BP: (!) 147/74  Pulse: 84  Resp: 16  Temp: (!) 97.1 F (36.2 C)  SpO2: 94%   Filed Weights   01/27/19 1537  Weight: (!) 312 lb 3.2 oz (141.6 kg)    Physical Exam Vitals signs reviewed.  Constitutional:      Appearance: Normal appearance.  Cardiovascular:     Rate and  Rhythm: Normal rate and regular rhythm.     Heart sounds: Normal heart sounds.  Pulmonary:     Effort: Pulmonary effort is normal.     Breath sounds: Normal breath sounds.  Abdominal:     General: There is no distension.     Palpations: Abdomen is soft. There is no mass.  Musculoskeletal:        General: No swelling.  Skin:    General: Skin is warm.  Neurological:     General: No focal deficit present.     Mental Status:  She is alert and oriented to person, place, and time.  Psychiatric:        Mood and Affect: Mood normal.        Behavior: Behavior normal.      LABORATORY DATA:  I have reviewed the labs as listed.  CBC    Component Value Date/Time   WBC 8.1 01/12/2019 0350   RBC 4.03 01/12/2019 0350   HGB 11.4 (L) 01/12/2019 0350   HCT 34.3 (L) 01/12/2019 0350   PLT 211 01/12/2019 0350   MCV 85.1 01/12/2019 0350   MCH 28.3 01/12/2019 0350   MCHC 33.2 01/12/2019 0350   RDW 14.2 01/12/2019 0350   CMP Latest Ref Rng & Units 01/12/2019 01/11/2019 01/07/2019  Glucose 70 - 99 mg/dL 130(H) - 131(H)  BUN 6 - 20 mg/dL 15 - 16  Creatinine 0.44 - 1.00 mg/dL 0.94 - 0.91  Sodium 135 - 145 mmol/L 134(L) 136 138  Potassium 3.5 - 5.1 mmol/L 3.5 4.1 4.0  Chloride 98 - 111 mmol/L 101 - 105  CO2 22 - 32 mmol/L 24 - 24  Calcium 8.9 - 10.3 mg/dL 7.4(L) - 8.9  Total Protein 6.5 - 8.1 g/dL - - 6.1(L)  Total Bilirubin 0.3 - 1.2 mg/dL - - 0.5  Alkaline Phos 38 - 126 U/L - - 78  AST 15 - 41 U/L - - 20  ALT 0 - 44 U/L - - 31       DIAGNOSTIC IMAGING:  I have independently reviewed the scans and discussed with the patient.     ASSESSMENT & PLAN:   Nodule of left lung 1.  Left upper lobe lung nodule: - Patient felt pain in the left chest wall, went to the ER on 11/10/2018, chest x-ray showed abnormal findings. - PET scan on 11/23/2018 shows left upper lobe lung nodule measuring 1.7 cm with SUV of 4.2.  Single focus of metabolic activity within the mid vertebral body at T12 level.   SUV 5.6. -Quit smoking 9 years ago.  Smoked 1 to 2 packs/day for 20 years. -MRI of the T-spine with contrast on 12/14/2018 did not show any evidence of metastatic disease.  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 -MRI of the brain on 12/14/2018 did not show any evidence of metastatic disease. -She underwent wedge resection of the left upper lobe lung nodule by Dr. Kipp Brood.  This showed benign lung parenchyma with dense inflammation including microabscess.  No evidence of malignancy. -I discussed the pathology report in detail.  She will come back in 2 months with a chest x-ray.  2.  Health maintenance: -Mammogram on 09/14/2018 was BI-RADS Category 1. -Colonoscopy on 03/11/2018 showed 4 to 6 mm polyps, 7 in number in the sigmoid colon, in the descending colon at the splenic flexure, in the transverse colon and at the hepatic flexure.  Diverticulosis in the sigmoid colon.  Pathology consistent with tubular adenoma, sessile serrated polyp without dysplasia, hyperplastic polyp.   Total time spent is 25 minutes with more than 50% of time spent face-to-face discussing pathology report, surveillance plan, counseling and coordination of care.  Orders placed this encounter:  Orders Placed This Encounter  Procedures  . DG Chest 2 View      Derek Jack, [place] [phone]

## 2019-01-27 NOTE — Patient Instructions (Addendum)
[place] at [facility] Discharge Instructions  You were seen today by Dr. Delton Coombes. He went over your recent test results and how you've been feeling since your surgery. He will see you back in 2 months for labs, chest xray and follow up.   Thank you for choosing [place] at [facility] to provide your oncology and hematology care.  To afford each patient quality time with our provider, please arrive at least 15 minutes before your scheduled appointment time.   If you have a lab appointment with the [facility] please come in thru the  Main Entrance and check in at the main information desk  You need to re-schedule your appointment should you arrive 10 or more minutes late.  We strive to give you quality time with our providers, and arriving late affects you and other patients whose appointments are after yours.  Also, if you no show three or more times for appointments you may be dismissed from the clinic at the providers discretion.     Again, thank you for choosing [facility].  Our hope is that these requests will decrease the amount of time that you wait before being seen by our physicians.       _____________________________________________________________  Should you have questions after your visit to [facility], please contact our office at [phone] between the hours of 8:00 a.m. and 4:30 p.m.  Voicemails left after 4:00 p.m. will not be returned until the following business day.  For prescription refill requests, have your pharmacy contact our office and allow 72 hours.    Cancer Center Support Programs:   > Cancer Support Group  2nd Tuesday of the month 1pm-2pm, Journey Room

## 2019-02-04 ENCOUNTER — Encounter (HOSPITAL_COMMUNITY): Payer: Self-pay | Admitting: Hematology

## 2019-02-04 NOTE — Assessment & Plan Note (Signed)
1.  Left upper lobe lung nodule: - Patient felt pain in the left chest wall, went to the ER on 11/10/2018, chest x-ray showed abnormal findings. - PET scan on 11/23/2018 shows left upper lobe lung nodule measuring 1.7 cm with SUV of 4.2.  Single focus of metabolic activity within the mid vertebral body at T12 level.  SUV 5.6. -Quit smoking 9 years ago.  Smoked 1 to 2 packs/day for 20 years. -MRI of the T-spine with contrast on 12/14/2018 did not show any evidence of metastatic disease.  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 -MRI of the brain on 12/14/2018 did not show any evidence of metastatic disease. -She underwent wedge resection of the left upper lobe lung nodule by Dr. Kipp Brood.  This showed benign lung parenchyma with dense inflammation including microabscess.  No evidence of malignancy. -I discussed the pathology report in detail.  She will come back in 2 months with a chest x-ray.  2.  Health maintenance: -Mammogram on 09/14/2018 was BI-RADS Category 1. -Colonoscopy on 03/11/2018 showed 4 to 6 mm polyps, 7 in number in the sigmoid colon, in the descending colon at the splenic flexure, in the transverse colon and at the hepatic flexure.  Diverticulosis in the sigmoid colon.  Pathology consistent with tubular adenoma, sessile serrated polyp without dysplasia, hyperplastic polyp.

## 2019-02-14 ENCOUNTER — Telehealth: Payer: Self-pay

## 2019-02-14 NOTE — Telephone Encounter (Signed)
Pt calls office w/ c/o recent onset of pain to surgical area (VATS/LUL wedge resection on 01/11/19). She states "the numbing has just recently worn off, so that's probably the reason I'm feeling it now." Pt considers the pain to be moderate and states it's a "pulling sensation" that worsens with movement--not sharp or related to respirations. She denies shortness of breath and reports no s/s of infection. States she is taking Tramadol and/or Tylenol for pain management. Advised to continue taking prn pain meds as prescribed and to try alternating ice/heat to the area. Instructed to seek immediate medical attention for shortness of breath and/or sharp pain and advised to call office if pain increases or doesn't begin to improve within a few days.

## 2019-02-17 ENCOUNTER — Telehealth: Payer: Self-pay

## 2019-02-17 ENCOUNTER — Emergency Department (HOSPITAL_COMMUNITY)
Admission: EM | Admit: 2019-02-17 | Discharge: 2019-02-17 | Disposition: A | Discharge status: Home / Self Care | Payer: PRIVATE HEALTH INSURANCE | Attending: Emergency Medicine | Admitting: Emergency Medicine

## 2019-02-17 ENCOUNTER — Encounter (HOSPITAL_COMMUNITY): Payer: Self-pay | Admitting: Emergency Medicine

## 2019-02-17 ENCOUNTER — Emergency Department (HOSPITAL_COMMUNITY): Payer: PRIVATE HEALTH INSURANCE

## 2019-02-17 ENCOUNTER — Other Ambulatory Visit: Payer: Self-pay

## 2019-02-17 DIAGNOSIS — E119 Type 2 diabetes mellitus without complications: Secondary | ICD-10-CM | POA: Diagnosis not present

## 2019-02-17 DIAGNOSIS — G8918 Other acute postprocedural pain: Secondary | ICD-10-CM | POA: Diagnosis not present

## 2019-02-17 DIAGNOSIS — R0789 Other chest pain: Secondary | ICD-10-CM | POA: Diagnosis not present

## 2019-02-17 DIAGNOSIS — I10 Essential (primary) hypertension: Secondary | ICD-10-CM | POA: Diagnosis not present

## 2019-02-17 DIAGNOSIS — Z87891 Personal history of nicotine dependence: Secondary | ICD-10-CM | POA: Diagnosis not present

## 2019-02-17 DIAGNOSIS — R1031 Right lower quadrant pain: Secondary | ICD-10-CM | POA: Diagnosis present

## 2019-02-17 DIAGNOSIS — Z79899 Other long term (current) drug therapy: Secondary | ICD-10-CM | POA: Insufficient documentation

## 2019-02-17 DIAGNOSIS — Z7984 Long term (current) use of oral hypoglycemic drugs: Secondary | ICD-10-CM | POA: Insufficient documentation

## 2019-02-17 LAB — URINALYSIS, ROUTINE W REFLEX MICROSCOPIC
Bilirubin Urine: NEGATIVE
Glucose, UA: NEGATIVE mg/dL
Hgb urine dipstick: NEGATIVE
Ketones, ur: NEGATIVE mg/dL
Leukocytes,Ua: NEGATIVE
Nitrite: NEGATIVE
Protein, ur: NEGATIVE mg/dL
Specific Gravity, Urine: 1.024 (ref 1.005–1.030)
pH: 5 (ref 5.0–8.0)

## 2019-02-17 LAB — COMPREHENSIVE METABOLIC PANEL
ALT: 29 U/L (ref 0–44)
AST: 23 U/L (ref 15–41)
Albumin: 3.8 g/dL (ref 3.5–5.0)
Alkaline Phosphatase: 90 U/L (ref 38–126)
Anion gap: 10 (ref 5–15)
BUN: 15 mg/dL (ref 6–20)
CO2: 25 mmol/L (ref 22–32)
Calcium: 8.7 mg/dL — ABNORMAL LOW (ref 8.9–10.3)
Chloride: 101 mmol/L (ref 98–111)
Creatinine, Ser: 0.87 mg/dL (ref 0.44–1.00)
GFR calc Af Amer: >60 mL/min (ref >60)
GFR calc non Af Amer: >60 mL/min (ref >60)
Glucose, Bld: 184 mg/dL — ABNORMAL HIGH (ref 70–99)
Potassium: 3.9 mmol/L (ref 3.5–5.1)
Sodium: 136 mmol/L (ref 135–145)
Total Bilirubin: 0.8 mg/dL (ref 0.3–1.2)
Total Protein: 6.4 g/dL — ABNORMAL LOW (ref 6.5–8.1)

## 2019-02-17 LAB — CBC WITH DIFFERENTIAL/PLATELET
Abs Immature Granulocytes: 0.04 10*3/uL (ref 0.00–0.07)
Basophils Absolute: 0.1 10*3/uL (ref 0.0–0.1)
Basophils Relative: 1 %
Eosinophils Absolute: 0.1 10*3/uL (ref 0.0–0.5)
Eosinophils Relative: 1 %
HCT: 42.5 % (ref 36.0–46.0)
Hemoglobin: 13.6 g/dL (ref 12.0–15.0)
Immature Granulocytes: 1 %
Lymphocytes Relative: 18 %
Lymphs Abs: 1.2 10*3/uL (ref 0.7–4.0)
MCH: 27.2 pg (ref 26.0–34.0)
MCHC: 32 g/dL (ref 30.0–36.0)
MCV: 85 fL (ref 80.0–100.0)
Monocytes Absolute: 0.4 10*3/uL (ref 0.1–1.0)
Monocytes Relative: 7 %
Neutro Abs: 4.7 10*3/uL (ref 1.7–7.7)
Neutrophils Relative %: 72 %
Platelets: 282 10*3/uL (ref 150–400)
RBC: 5 MIL/uL (ref 3.87–5.11)
RDW: 14.1 % (ref 11.5–15.5)
WBC: 6.5 10*3/uL (ref 4.0–10.5)
nRBC: 0 % (ref 0.0–0.2)

## 2019-02-17 MED ORDER — IOHEXOL 300 MG/ML  SOLN
100.0000 mL | Freq: Once | INTRAMUSCULAR | Status: AC | PRN
  Administered 2019-02-17: 15:00:00 100 mL via INTRAVENOUS

## 2019-02-17 MED ORDER — DOXYCYCLINE HYCLATE 100 MG PO CAPS: 100.0000 mg | ORAL_CAPSULE | Freq: Two times a day (BID) | ORAL | 0 refills | Status: DC

## 2019-02-17 MED ORDER — FLUCONAZOLE 150 MG PO TABS: 150.0000 mg | ORAL_TABLET | Freq: Once | ORAL | 0 refills | Status: AC

## 2019-02-17 MED ORDER — LORAZEPAM 2 MG/ML IJ SOLN
0.5000 mg | Freq: Once | INTRAMUSCULAR | Status: DC | PRN
  Filled 2019-02-17: qty 1

## 2019-02-17 NOTE — Discharge Instructions (Signed)
You have been diagnosed by your caregiver as having chest wall pain. °SEEK IMMEDIATE MEDICAL ATTENTION IF: °You develop a fever.  °Your chest pains become severe or intolerable.  °You develop new, unexplained symptoms (problems).  °You develop shortness of breath, nausea, vomiting, sweating or feel light headed.  °You develop a new cough or you cough up blood. ° °

## 2019-02-17 NOTE — Telephone Encounter (Signed)
Pt calls office this AM to report that pain to operative area has increased in intensity since last phone conversation earlier this week. States it is a sharp pain when she moves, otherwise a general aching. Pain rating of 8/10 at present. Pt denies dyspnea, s/s of infection, fever. Dr. Kipp Brood made aware and recommends that pt go to the ED to be evaluated and have a CT scan. Pt notified.

## 2019-02-17 NOTE — ED Triage Notes (Addendum)
Pt post op from a lung surgery on oct 6th.  Pt now c/o of left flank pain x 5 days.   Pt called her surgeon and was told to come to ED for a CT scan

## 2019-02-17 NOTE — ED Provider Notes (Signed)
[facility] EMERGENCY DEPARTMENT Provider Note   CSN: [id number] Arrival date & time: 02/17/19  1151     History   Chief Complaint Chief Complaint  Patient presents with  . Flank Pain    HPI Tanya Galloway is a 53 y.o. female who presents the emergency department with chief complaint right flank/abdominal pain. She has a past medical history of morbid obesity, diabetes, reflux, diverticulosis.  Patient had a VATS procedure of the left lung with suspicion for malignant nodule which was ruled out on 01/11/2019.  She states that she has been doing well.  She had stopped taking her pain medications many weeks ago.  She noticed about 1 week ago she began having some sharp pain that was worse with movement or palpation over the left lateral abdominal wall.  It has progressively worsened and now become much more tender to palpation, it is fine when she is at rest but anytime she sits or moves it is painful.  She had to take her tramadol today.  She denies fevers or chills.  She denies urinary symptoms.  She denies changes in bowel movements, nausea, vomiting or fever.  She has noticed no redness, rashes or drainage from the site of her surgery     HPI  Past Medical History:  Diagnosis Date  . Diabetes mellitus without complication ([place])   . Diverticulosis   . Fatty liver   . GERD (gastroesophageal reflux disease)   . H/O seasonal allergies   . Hemorrhoids   . Hypertension   . Irritable bowel syndrome     Patient Active Problem List   Diagnosis Date Noted  . S/P thoracotomy 01/11/2019  . Nodule of left lung 11/24/2018  . Special screening for malignant neoplasms, colon 01/05/2018  . GERD (gastroesophageal reflux disease) 12/15/2011  . HTN (hypertension) 09/16/2011  . IBS (irritable bowel syndrome) 09/16/2011  . Obesity 09/16/2011    Past Surgical History:  Procedure Laterality Date  . COLONOSCOPY N/A 03/11/2018   Procedure: COLONOSCOPY;  Surgeon: Rogene Houston, MD;  [facility];  Service: Endoscopy;  Laterality: N/A;  930  . KNEE ARTHROSCOPY  2011   left knee  . LOBECTOMY Left 01/11/2019   Procedure: possible LOBECTOMY;  Surgeon: Lajuana Matte, MD;  Location: [place];  Service: Thoracic;  Laterality: Left;  . POLYPECTOMY  03/11/2018   Procedure: POLYPECTOMY;  Surgeon: Rogene Houston, MD;  [facility]: [facility];  Service: Endoscopy;;  splenic flexure (CSx1), transverse colon (CBx1), hepatic flexure (CSx1), descending colon (CSx1)distal sigmoid colon(CSx3)  . VIDEO ASSISTED THORACOSCOPY (VATS)/WEDGE RESECTION Left 01/11/2019   Procedure: VIDEO ASSISTED THORACOSCOPY (VATS)/LEFT UPPER LOBE WEDGE RESECTION;  Surgeon: Lajuana Matte, MD;  Location: [place];  Service: Thoracic;  Laterality: Left;  Marland Kitchen VIDEO BRONCHOSCOPY N/A 01/11/2019   Procedure: VIDEO BRONCHOSCOPY;  Surgeon: Lajuana Matte, MD;  Location: [facility];  Service: Thoracic;  Laterality: N/A;     OB History   No obstetric history on file.      Home Medications    Prior to Admission medications   Medication Sig Start Date End Date Taking? Authorizing Provider  acetaminophen (TYLENOL) 500 MG tablet Take 2 tablets (1,000 mg total) by mouth every 6 (six) hours. 01/12/19  Yes Conte, Tessa N, PA-C  clobetasol (TEMOVATE) 0.05 % external solution Apply 1 application topically 2 (two) times daily as needed.  01/20/19  Yes [provider]  dicyclomine (BENTYL) 10 MG capsule Take 1 capsule (10 mg total)  by mouth 2 (two) times daily before a meal. 07/18/13  Yes Rehman, Mechele Dawley, MD  esomeprazole (NEXIUM) 20 MG capsule Take 20 mg by mouth daily as needed (heartburn).    Yes [provider]  guaiFENesin (MUCINEX) 600 MG 12 hr tablet Take 600 mg by mouth 2 (two) times daily as needed.   Yes [provider]  Lancets (ONETOUCH DELICA PLUS [id number]) [place] USE TO TEST FASTING BLOOD SUGAR D 01/19/19  Yes [provider]  metFORMIN (GLUCOPHAGE-XR) 500 MG 24 hr tablet  Take 500-1,000 mg by mouth See admin instructions. Take 1 tablet (500 mg) in the morning & 2 tablets (1000 mg) by mouth in the evening. 01/27/18  Yes [provider]  ONETOUCH VERIO test strip 1 each by Other route daily.  01/19/19  Yes [provider]  traMADol (ULTRAM) 50 MG tablet Take 50 mg by mouth every 6 (six) hours as needed.   Yes [provider]  doxycycline (VIBRAMYCIN) 100 MG capsule Take 1 capsule (100 mg total) by mouth 2 (two) times daily. 02/17/19   Margarita Mail, PA-C  fluconazole (DIFLUCAN) 150 MG tablet Take 1 tablet (150 mg total) by mouth once for 1 dose. 02/17/19 02/17/19  Margarita Mail, PA-C    Family History Family History  Problem Relation Age of Onset  . Diabetes Mother   . Thyroid disease Mother   . Hypertension Mother   . Hypertension Father   . Hypertension Brother   . Healthy Brother   . Breast cancer Maternal Grandmother   . Colon cancer Neg Hx     Social History Social History   Tobacco Use  . Smoking status: Former Smoker    Types: Cigarettes    Quit date: 09/15/2009    Years since quitting: 9.4  . Smokeless tobacco: Never Used  . Tobacco comment: Patient smoked for 25 years, 1 1/2 ppd  Substance Use Topics  . Alcohol use: Yes    Comment: Twice a Year  . Drug use: No     Allergies   Sulfa antibiotics   Review of Systems Review of Systems  Ten systems reviewed and are negative for acute change, except as noted in the HPI.   Physical Exam Updated Vital Signs BP 122/84   Pulse 84   Temp 98.1 F (36.7 C) (Oral)   Resp 16   Ht 5\' 9"  (1.753 m)   Wt (!) 141.5 kg   SpO2 100%   BMI 46.07 kg/m   Physical Exam Vitals signs and nursing note reviewed.  Constitutional:      General: She is not in acute distress.    Appearance: She is well-developed. She is obese. She is not diaphoretic.  HENT:     Head: Normocephalic and atraumatic.  Eyes:     General: No scleral icterus.    Conjunctiva/sclera:  Conjunctivae normal.  Neck:     Musculoskeletal: Normal range of motion.  Cardiovascular:     Rate and Rhythm: Normal rate and regular rhythm.     Heart sounds: Normal heart sounds. No murmur. No friction rub. No gallop.   Pulmonary:     Effort: Pulmonary effort is normal. No respiratory distress.     Breath sounds: Normal breath sounds.  Chest:    Abdominal:     General: Bowel sounds are normal. There is no distension.     Palpations: Abdomen is soft. There is no mass.     Tenderness: There is abdominal tenderness.  Comments: Palpable, firm swelling on the left lateral abdominal wall.  This seems superficial.  She does not have any abdominal tenderness to deep palpation.  Skin:    General: Skin is warm and dry.  Neurological:     Mental Status: She is alert and oriented to person, place, and time.  Psychiatric:        Behavior: Behavior normal.      ED Treatments / Results  Labs (all labs ordered are listed, but only abnormal results are displayed) Labs Reviewed  COMPREHENSIVE METABOLIC PANEL - Abnormal; Notable for the following components:      Result Value   Glucose, Bld 184 (*)    Calcium 8.7 (*)    Total Protein 6.4 (*)    All other components within normal limits  CBC WITH DIFFERENTIAL/PLATELET  URINALYSIS, ROUTINE W REFLEX MICROSCOPIC    EKG None  Radiology Ct Abdomen Pelvis W Contrast  Addendum Date: 02/17/2019   ADDENDUM REPORT: 02/17/2019 15:48 ADDENDUM: On review of the exam, there is hazy soft tissue attenuation in the subcutaneous fat of the lateral left upper abdominal wall as consistent with inflammation. No masses or collection. Electronically Signed   By: Lajean Manes M.D.   On: 02/17/2019 15:48   Result Date: 02/17/2019 CLINICAL DATA:  Abdominal distension. Abdominal pain over the left lateral abdominal wall. Postop from lung surgery on 01/11/2019. Left-sided pain for 5 days. EXAM: CT ABDOMEN AND PELVIS WITH CONTRAST TECHNIQUE:  Multidetector CT imaging of the abdomen and pelvis was performed using the standard protocol following bolus administration of intravenous contrast. CONTRAST:  181mL OMNIPAQUE IOHEXOL 300 MG/ML  SOLN COMPARISON:  None. FINDINGS: Lower chest: Heart normal size. Minor linear atelectasis at the anterolateral left lung base. Lung bases otherwise clear. Trace amount of left pleural fluid. Hepatobiliary: Liver normal in overall size. Diffuse decreased attenuation consistent with fatty infiltration. No liver mass or focal lesion. Normal gallbladder. No bile duct dilation. Pancreas: Unremarkable. No pancreatic ductal dilatation or surrounding inflammatory changes. Spleen: Normal in size without focal abnormality. Adrenals/Urinary Tract: No adrenal masses. Kidneys normal size, orientation and position. No renal masses, stones or hydronephrosis. Normal ureters. Normal bladder. Stomach/Bowel: Stomach is within normal limits. Appendix appears normal. No evidence of bowel wall thickening, distention, or inflammatory changes. Vascular/Lymphatic: No significant vascular findings are present. No enlarged abdominal or pelvic lymph nodes. Reproductive: Uterus and bilateral adnexa are unremarkable. Other: No abdominal wall hernia or abnormality. No abdominopelvic ascites. Musculoskeletal: No fracture or acute finding. No bone lesion. There are significant degenerative changes of the lower lumbar spine. IMPRESSION: 1. No acute findings within the abdomen or pelvis. No findings to account for the left flank pain. 2. Hepatic steatosis. 3. Minor left anterolateral lung base atelectasis and trace left pleural effusion. Electronically Signed: By: Lajean Manes M.D. On: 02/17/2019 15:32    Procedures Procedures (including critical care time)  Medications Ordered in ED Medications  iohexol (OMNIPAQUE) 300 MG/ML solution 100 mL (100 mLs Intravenous Contrast Given 02/17/19 1525)     Initial Impression / Assessment and Plan / ED  Course  I have reviewed the triage vital signs and the nursing notes.  Pertinent labs & imaging results that were available during my care of the patient were reviewed by me and considered in my medical decision making (see chart for details).        53 year old female here with left sided tenderness after chest tube insertion and VATS procedure.  Personally reviewed the patient's imaging labs which show  hyperglycemia, no other acute abnormalities noted personally reviewed the CT images which show an area of inflammatory change in the left upper lateral abdominal/chest wall.  This is likely secondary to some fat necrosis or inflammatory change from the chest tube.  No evidence of fluid collection on my interpretation.  Patient will be started on doxycycline.  She may take her tramadol and follow-up closely with her surgeon.  I discussed all findings with the patient at bedside.  She appears appropriate for discharge at this time Final Clinical Impressions(s) / ED Diagnoses   Final diagnoses:  Chest wall pain following surgery    ED Discharge Orders         Ordered    doxycycline (VIBRAMYCIN) 100 MG capsule  2 times daily     02/17/19 1552    fluconazole (DIFLUCAN) 150 MG tablet   Once     02/17/19 1552           Margarita Mail, PA-C 02/17/19 2148    Isla Pence, MD 02/18/19 0700

## 2019-02-21 ENCOUNTER — Encounter: Payer: Self-pay | Admitting: Thoracic Surgery (Cardiothoracic Vascular Surgery)

## 2019-02-21 ENCOUNTER — Other Ambulatory Visit: Payer: Self-pay

## 2019-02-21 ENCOUNTER — Ambulatory Visit (INDEPENDENT_AMBULATORY_CARE_PROVIDER_SITE_OTHER): Payer: Self-pay | Admitting: Surgical

## 2019-02-21 VITALS — BP 118/78 | HR 93 | Temp 98.0°F | Resp 16 | Ht 69.0 in | Wt 312.0 lb

## 2019-02-21 DIAGNOSIS — R911 Solitary pulmonary nodule: Secondary | ICD-10-CM

## 2019-02-21 DIAGNOSIS — Z09 Encounter for follow-up examination after completed treatment for conditions other than malignant neoplasm: Secondary | ICD-10-CM

## 2019-02-21 NOTE — Progress Notes (Signed)
[address]       [place] [address]             [phone]           [address]       [place] [address]             [phone]      Belky J Norden [place] Medical Record #[id number] Date of Birth: 10-16-1965  Referring: Derek Jack, MD Primary Care: Caryl Bis, MD Primary Cardiologist: No primary care provider on file.   Chief Complaint:   POST OP FOLLOW UP 01/11/2019  Patient:  Tanya Galloway Pre-Op Dx: Left upper lobe solitary pulmonary nodule   Post-op Dx:  same Procedure: - Bronchoscopy - Left Video assisted thoracoscopy - Left upper lobe wedge resection - Intercostal nerve block  Surgeon and Role:      * Lightfoot, Lucile Crater, MD - Primary    * T. Harriet Pho, PA-C - assisting  Anesthesia  general   History of Present Illness:    The patient is a 53 year old female status post the above described procedure.  She was seen in the emergency department last Thursday due to left-sided flank pain.  A CT scan was performed at that time which showed mild atelectasis and small amount of pleural fluid but no significant findings that would explain the flank pain.  She reports that the discomfort is making it difficult to sleep.  She denies fevers, chills or other significant constitutional symptoms.  She says the Ultram does not give significant relief.  She is not having any shortness of breath.  She is tolerating routine ADLs and ambulation.  She was started on a short course of doxycycline for 7 days by the emergency physician.  She is primarily concerned that there is a problem that is unrecognized.      Past Medical History:  Diagnosis Date   Diabetes mellitus without complication ([facility])    Diverticulosis    Fatty liver    GERD (gastroesophageal reflux disease)    H/O seasonal allergies    Hemorrhoids    Hypertension    Irritable bowel syndrome      Social History   Tobacco Use  Smoking Status  Former Smoker   Types: Cigarettes   Quit date: 09/15/2009   Years since quitting: 9.4  Smokeless Tobacco Never Used  Tobacco Comment   Patient smoked for 25 years, 1 1/2 ppd    Social History   Substance and Sexual Activity  Alcohol Use Yes   Comment: Twice a Year     Allergies  Allergen Reactions   Sulfa Antibiotics Nausea Only and Swelling    SWELLING REACTION UNSPECIFIED     Current Outpatient Medications  Medication Sig Dispense Refill   acetaminophen (TYLENOL) 500 MG tablet Take 2 tablets (1,000 mg total) by mouth every 6 (six) hours. 30 tablet 0   clobetasol (TEMOVATE) 0.05 % external solution Apply 1 application topically 2 (two) times daily as needed.      dicyclomine (BENTYL) 10 MG capsule Take 1 capsule (10 mg total) by mouth 2 (two) times daily before a meal. 180 capsule 3   doxycycline (VIBRAMYCIN) 100 MG capsule Take 1 capsule (100 mg total) by mouth 2 (two) times daily. 14 capsule 0   esomeprazole (NEXIUM) 20 MG capsule Take 20 mg by mouth daily as needed (heartburn).      guaiFENesin (MUCINEX) 600 MG 12 hr  tablet Take 600 mg by mouth 2 (two) times daily as needed.     Lancets (ONETOUCH DELICA PLUS [id number]) MISC USE TO TEST FASTING BLOOD SUGAR D     metFORMIN (GLUCOPHAGE-XR) 500 MG 24 hr tablet Take 500-1,000 mg by mouth See admin instructions. Take 1 tablet (500 mg) in the morning & 2 tablets (1000 mg) by mouth in the evening.  2   ONETOUCH VERIO test strip 1 each by Other route daily.      traMADol (ULTRAM) 50 MG tablet Take 50 mg by mouth every 6 (six) hours as needed.     No current facility-administered medications for this visit.        Physical Exam: There were no vitals taken for this visit.  General appearance: alert, cooperative and no distress Heart: regular rate and rhythm Lungs: Clear breath sounds Abdomen: Obese Wound: Incisions well-healed without evidence of infection.   Diagnostic Studies & Laboratory data:     Recent  Radiology Findings:   No results found.    Recent Lab Findings: Lab Results  Component Value Date   WBC 6.5 02/17/2019   HGB 13.6 02/17/2019   HCT 42.5 02/17/2019   PLT 282 02/17/2019   GLUCOSE 184 (H) 02/17/2019   ALT 29 02/17/2019   AST 23 02/17/2019   NA 136 02/17/2019   K 3.9 02/17/2019   CL 101 02/17/2019   CREATININE 0.87 02/17/2019   BUN 15 02/17/2019   CO2 25 02/17/2019   INR 1.1 01/07/2019   HGBA1C 6.8 (H) 01/07/2019   Dg Chest 2 View  Result Date: 01/21/2019 CLINICAL DATA:  Left lung nodule. EXAM: CHEST - 2 VIEW COMPARISON:  01/12/2019. FINDINGS: Interval removal of left IJ line. Interim removal of left chest tube. Heart size stable. Left lower lung infiltrate and moderate left pleural effusion noted. No pneumothorax. Interim improvement of left chest wall subcutaneous emphysema. IMPRESSION: 1. Interim removal of left IJ line and left chest tube. No pneumothorax. Interim improvement of left chest wall subcutaneous emphysema. 2.  Left lower lung infiltrate and moderate left pleural effusion. Electronically Signed   By: Marcello Moores  Register   On: 01/21/2019 12:39   Dg Chest 2 View  Result Date: 01/07/2019 CLINICAL DATA:  Preop for left upper lobe node biopsy EXAM: CHEST - 2 VIEW COMPARISON:  November 10, 2018 FINDINGS: Redemonstrated 2 cm nodule in the lingula. No consolidation, features of edema, pneumothorax, or effusion. Pulmonary vascularity is normally distributed. The cardiomediastinal contours are unremarkable. No acute osseous or soft tissue abnormality. IMPRESSION: Similar appearance of a 2 cm nodule in the lingula. No acute cardiopulmonary disease. Electronically Signed   By: Lovena Le M.D.   On: 01/07/2019 16:14   Ct Abdomen Pelvis W Contrast  Addendum Date: 02/17/2019   ADDENDUM REPORT: 02/17/2019 15:48 ADDENDUM: On review of the exam, there is hazy soft tissue attenuation in the subcutaneous fat of the lateral left upper abdominal wall as consistent with  inflammation. No masses or collection. Electronically Signed   By: Lajean Manes M.D.   On: 02/17/2019 15:48   Result Date: 02/17/2019 CLINICAL DATA:  Abdominal distension. Abdominal pain over the left lateral abdominal wall. Postop from lung surgery on 01/11/2019. Left-sided pain for 5 days. EXAM: CT ABDOMEN AND PELVIS WITH CONTRAST TECHNIQUE: Multidetector CT imaging of the abdomen and pelvis was performed using the standard protocol following bolus administration of intravenous contrast. CONTRAST:  166mL OMNIPAQUE IOHEXOL 300 MG/ML  SOLN COMPARISON:  None. FINDINGS: Lower chest: Heart normal size.  Minor linear atelectasis at the anterolateral left lung base. Lung bases otherwise clear. Trace amount of left pleural fluid. Hepatobiliary: Liver normal in overall size. Diffuse decreased attenuation consistent with fatty infiltration. No liver mass or focal lesion. Normal gallbladder. No bile duct dilation. Pancreas: Unremarkable. No pancreatic ductal dilatation or surrounding inflammatory changes. Spleen: Normal in size without focal abnormality. Adrenals/Urinary Tract: No adrenal masses. Kidneys normal size, orientation and position. No renal masses, stones or hydronephrosis. Normal ureters. Normal bladder. Stomach/Bowel: Stomach is within normal limits. Appendix appears normal. No evidence of bowel wall thickening, distention, or inflammatory changes. Vascular/Lymphatic: No significant vascular findings are present. No enlarged abdominal or pelvic lymph nodes. Reproductive: Uterus and bilateral adnexa are unremarkable. Other: No abdominal wall hernia or abnormality. No abdominopelvic ascites. Musculoskeletal: No fracture or acute finding. No bone lesion. There are significant degenerative changes of the lower lumbar spine. IMPRESSION: 1. No acute findings within the abdomen or pelvis. No findings to account for the left flank pain. 2. Hepatic steatosis. 3. Minor left anterolateral lung base atelectasis and trace  left pleural effusion. Electronically Signed: By: Lajean Manes M.D. On: 02/17/2019 15:32   Dg Chest Port 1 View  Result Date: 01/12/2019 CLINICAL DATA:  Patient status post left thoracotomy for a pulmonary nodule 01/11/2019. EXAM: PORTABLE CHEST 1 VIEW COMPARISON:  Single-view of the chest 01/11/2019. FINDINGS: Left IJ catheter projecting at the superior margin of the aortic arch is unchanged. Left chest tube has backed out slightly. Subcutaneous emphysema in the left chest wall is decreased. Left basilar atelectasis and a small focus of atelectasis in the right mid lung again seen. There is likely a small left pleural effusion. IMPRESSION: Left IJ catheter tip continues to project at the aortic arch and could be arterial. Left chest tube has backed out slightly.  No pneumothorax. No change in left greater than right atelectasis and likely small left pleural effusion. Electronically Signed   By: Inge Rise M.D.   On: 01/12/2019 10:08   Dg Chest Port 1 View  Result Date: 01/11/2019 CLINICAL DATA:  Post thoracotomy. EXAM: PORTABLE CHEST 1 VIEW COMPARISON:  01/07/2019 FINDINGS: Left IJ central venous catheter has tip projecting over the superior aspect of the aortic arch as this may be arterial. Left apical chest tube in adequate position. Lungs are hypoinflated with mild opacification over the right midlung and left mid to lower lung likely atelectasis. Surgical clips over the lateral left midlung. No definite pneumothorax visualized. No effusion. Cardiac silhouette within normal. Mild prominence of the main pulmonary artery segment. Remainder of the exam is unchanged. IMPRESSION: 1. Postsurgical change over the lateral left midlung. Left apical chest tube in place. No pneumothorax. Mild opacification over the left mid to lower lung and right midlung likely atelectasis. 2. Left IJ catheter with tip over the medial aspect of the aortic arch as this may be arterial in nature. Recommend clinical  correlation. These results were called by telephone at the time of interpretation on 01/11/2019 at 12:10 pm to patient's nurse, Ileene Rubens, who verbally acknowledged these results. Electronically Signed   By: Marin Olp M.D.   On: 01/11/2019 12:11    Assessment / Plan: The patient is a 53 year old female status post left upper lobe benign wedge resection.  I see no worrisome findings on examination.  I encouraged her to continue her full course of the doxycycline.  There may be an inflammatory component to her discomfort so I advised her to take a short course of over-the-counter  NSAIDs for the anti-inflammatory effect.  We will see her again on a as needed basis.      Medication Changes: No orders of the defined types were placed in this encounter.   John Giovanni, PA-C 02/21/2019 3:13 PM

## 2019-02-21 NOTE — Patient Instructions (Signed)
F/u prn NSAID prn Moist heat prn

## 2019-04-04 ENCOUNTER — Other Ambulatory Visit: Payer: Self-pay

## 2019-04-04 ENCOUNTER — Ambulatory Visit (HOSPITAL_COMMUNITY)
Admission: RE | Admit: 2019-04-04 | Discharge: 2019-04-04 | Disposition: A | Discharge status: Home / Self Care | Payer: PRIVATE HEALTH INSURANCE | Source: Ambulatory Visit | Attending: Hematology | Admitting: Hematology

## 2019-04-04 DIAGNOSIS — J90 Pleural effusion, not elsewhere classified: Secondary | ICD-10-CM

## 2019-04-06 ENCOUNTER — Inpatient Hospital Stay (HOSPITAL_COMMUNITY): Payer: PRIVATE HEALTH INSURANCE | Attending: Nurse Practitioner | Admitting: Nurse Practitioner

## 2019-04-06 ENCOUNTER — Other Ambulatory Visit: Payer: Self-pay

## 2019-04-06 DIAGNOSIS — R911 Solitary pulmonary nodule: Secondary | ICD-10-CM | POA: Insufficient documentation

## 2019-04-06 NOTE — Progress Notes (Signed)
Tanya Galloway, Tanya Galloway   CLINIC:  Medical Oncology/Hematology  PCP:  Caryl Bis, MD [place] [id number] [phone]   REASON FOR VISIT: Follow-up for benign left upper lobe lung nodule  CURRENT THERAPY: Observation   INTERVAL HISTORY:  Tanya Galloway 53 y.o. female returns for routine follow-up for benign left upper lung nodule.  Patient reports she is recovering well after surgery.  She has no complaints at this time. Denies any nausea, vomiting, or diarrhea. Denies any new pains. Had not noticed any recent bleeding such as epistaxis, hematuria or hematochezia. Denies recent chest pain on exertion, shortness of breath on minimal exertion, pre-syncopal episodes, or palpitations. Denies any numbness or tingling in hands or feet. Denies any recent fevers, infections, or recent hospitalizations. Patient reports appetite at 100% and energy level at 100%.  She is eating well maintain her weight at this time.    REVIEW OF SYSTEMS:  Review of Systems  All other systems reviewed and are negative.    PAST MEDICAL/SURGICAL HISTORY:  Past Medical History:  Diagnosis Date  . Diabetes mellitus without complication ([place])   . Diverticulosis   . Fatty liver   . GERD (gastroesophageal reflux disease)   . H/O seasonal allergies   . Hemorrhoids   . Hypertension   . Irritable bowel syndrome    Past Surgical History:  Procedure Laterality Date  . COLONOSCOPY N/A 03/11/2018   Procedure: COLONOSCOPY;  Surgeon: Rogene Houston, MD;  [facility]: [facility];  Service: Endoscopy;  Laterality: N/A;  930  . KNEE ARTHROSCOPY  2011   left knee  . LOBECTOMY Left 01/11/2019   Procedure: possible LOBECTOMY;  Surgeon: Lajuana Matte, MD;  Location: [facility];  Service: Thoracic;  Laterality: Left;  . POLYPECTOMY  03/11/2018   Procedure: POLYPECTOMY;  Surgeon: Rogene Houston, MD;  Location: [facility];  Service: Endoscopy;;  splenic flexure  (CSx1), transverse colon (CBx1), hepatic flexure (CSx1), descending colon (CSx1)distal sigmoid colon(CSx3)  . VIDEO ASSISTED THORACOSCOPY (VATS)/WEDGE RESECTION Left 01/11/2019   Procedure: VIDEO ASSISTED THORACOSCOPY (VATS)/LEFT UPPER LOBE WEDGE RESECTION;  Surgeon: Lajuana Matte, MD;  Location: [facility];  Service: Thoracic;  Laterality: Left;  Marland Kitchen VIDEO BRONCHOSCOPY N/A 01/11/2019   Procedure: VIDEO BRONCHOSCOPY;  Surgeon: Lajuana Matte, MD;  Location: [facility];  Service: Thoracic;  Laterality: N/A;     SOCIAL HISTORY:  Social History   Socioeconomic History  . Marital status: Married    Spouse name: Merrianne Mccumbers.  . Number of children: Not on file  . Years of education: Not on file  . Highest education level: Not on file  Occupational History  . Not on file  Tobacco Use  . Smoking status: Former Smoker    Types: Cigarettes    Quit date: 09/15/2009    Years since quitting: 9.5  . Smokeless tobacco: Never Used  . Tobacco comment: Patient smoked for 25 years, 1 1/2 ppd  Substance and Sexual Activity  . Alcohol use: Yes    Comment: Twice a Year  . Drug use: No  . Sexual activity: Not on file  Other Topics Concern  . Not on file  Social History Narrative  . Not on file   Social Determinants of Health   Financial Resource Strain: Low Risk   . Difficulty of Paying Living Expenses: Not hard at all  Food Insecurity: No Food Insecurity  . Worried About Estate manager/land agent  of Food in the Last Year: Never true  . Ran Out of Food in the Last Year: Never true  Transportation Needs: No Transportation Needs  . Lack of Transportation (Medical): No  . Lack of Transportation (Non-Medical): No  Physical Activity: Insufficiently Active  . Days of Exercise per Week: 1 day  . Minutes of Exercise per Session: 10 min  Stress: No Stress Concern Present  . Feeling of Stress : Only a little  Social Connections: Slightly Isolated  . Frequency of Communication with Friends and Family: More than  three times a week  . Frequency of Social Gatherings with Friends and Family: More than three times a week  . Attends Religious Services: More than 4 times per year  . Active Member of Clubs or Organizations: No  . Attends Archivist Meetings: Never  . Marital Status: Married  Human resources officer Violence: Not At Risk  . Fear of Current or Ex-Partner: No  . Emotionally Abused: No  . Physically Abused: No  . Sexually Abused: No    FAMILY HISTORY:  Family History  Problem Relation Age of Onset  . Diabetes Mother   . Thyroid disease Mother   . Hypertension Mother   . Hypertension Father   . Hypertension Brother   . Healthy Brother   . Breast cancer Maternal Grandmother   . Colon cancer Neg Hx     CURRENT MEDICATIONS:  Outpatient Encounter Medications as of 04/06/2019  Medication Sig  . dicyclomine (BENTYL) 10 MG capsule Take 1 capsule (10 mg total) by mouth 2 (two) times daily before a meal.  . doxycycline (VIBRAMYCIN) 100 MG capsule Take 1 capsule (100 mg total) by mouth 2 (two) times daily.  Marland Kitchen esomeprazole (NEXIUM) 20 MG capsule Take 20 mg by mouth daily as needed (heartburn).   . Lancets (ONETOUCH DELICA PLUS [id number]) MISC USE TO TEST FASTING BLOOD SUGAR D  . lisinopril-hydrochlorothiazide (ZESTORETIC) 20-12.5 MG tablet Take 1 tablet by mouth daily.  . metFORMIN (GLUCOPHAGE-XR) 500 MG 24 hr tablet Take 500-1,000 mg by mouth See admin instructions. Take 1 tablet (500 mg) in the morning & 2 tablets (1000 mg) by mouth in the evening.  [name] VERIO test strip 1 each by Other route daily.   Marland Kitchen acetaminophen (TYLENOL) 500 MG tablet Take 2 tablets (1,000 mg total) by mouth every 6 (six) hours. (Patient not taking: Reported on 04/06/2019)  . clobetasol (TEMOVATE) 0.05 % external solution Apply 1 application topically 2 (two) times daily as needed.   Marland Kitchen guaiFENesin (MUCINEX) 600 MG 12 hr tablet Take 600 mg by mouth 2 (two) times daily as needed.  . traMADol (ULTRAM) 50 MG  tablet Take 50 mg by mouth every 6 (six) hours as needed.   No facility-administered encounter medications on file as of 04/06/2019.    ALLERGIES:  Allergies  Allergen Reactions  . Sulfa Antibiotics Nausea Only and Swelling    SWELLING REACTION UNSPECIFIED      PHYSICAL EXAM:  ECOG Performance status: 1  Vitals:   04/06/19 1258  BP: 102/68  Pulse: 91  Resp: 18  Temp: 97.6 F (36.4 C)  SpO2: 98%   Filed Weights   04/06/19 1258  Weight: (!) 304 lb 3.2 oz (138 kg)    Physical Exam Constitutional:      Appearance: Normal appearance. She is normal weight.  Cardiovascular:     Rate and Rhythm: Normal rate and regular rhythm.     Heart sounds: Normal heart sounds.  Pulmonary:  Effort: Pulmonary effort is normal.     Breath sounds: Normal breath sounds.  Abdominal:     General: Bowel sounds are normal.     Palpations: Abdomen is soft.  Musculoskeletal:        General: Normal range of motion.  Skin:    General: Skin is warm.  Neurological:     Mental Status: She is alert and oriented to person, place, and time. Mental status is at baseline.  Psychiatric:        Mood and Affect: Mood normal.        Behavior: Behavior normal.        Thought Content: Thought content normal.        Judgment: Judgment normal.      LABORATORY DATA:  I have reviewed the labs as listed.  CBC    Component Value Date/Time   WBC 6.5 02/17/2019 1328   RBC 5.00 02/17/2019 1328   HGB 13.6 02/17/2019 1328   HCT 42.5 02/17/2019 1328   PLT 282 02/17/2019 1328   MCV 85.0 02/17/2019 1328   MCH 27.2 02/17/2019 1328   MCHC 32.0 02/17/2019 1328   RDW 14.1 02/17/2019 1328   LYMPHSABS 1.2 02/17/2019 1328   MONOABS 0.4 02/17/2019 1328   EOSABS 0.1 02/17/2019 1328   BASOSABS 0.1 02/17/2019 1328   CMP Latest Ref Rng & Units 02/17/2019 01/12/2019 01/11/2019  Glucose 70 - 99 mg/dL 184(H) 130(H) -  BUN 6 - 20 mg/dL 15 15 -  Creatinine 0.44 - 1.00 mg/dL 0.87 0.94 -  Sodium 135 - 145 mmol/L  136 134(L) 136  Potassium 3.5 - 5.1 mmol/L 3.9 3.5 4.1  Chloride 98 - 111 mmol/L 101 101 -  CO2 22 - 32 mmol/L 25 24 -  Calcium 8.9 - 10.3 mg/dL 8.7(L) 7.4(L) -  Total Protein 6.5 - 8.1 g/dL 6.4(L) - -  Total Bilirubin 0.3 - 1.2 mg/dL 0.8 - -  Alkaline Phos 38 - 126 U/L 90 - -  AST 15 - 41 U/L 23 - -  ALT 0 - 44 U/L 29 - -    DIAGNOSTIC IMAGING:  I have independently reviewed the S x-ray and discussed with the patient.  I personally performed a face-to-face visit.  All questions were answered to patient's stated satisfaction. Encouraged patient to call with any new concerns or questions before his next visit to the cancer center and we can certain see him sooner, if needed.     ASSESSMENT & PLAN:   Nodule of left lung 1.  Left upper lobe lung nodule: -Patient felt pain in the left chest wall, went to the ER on 11/10/2018, chest x-ray showed abnormal findings. -PET scan on 11/23/2018 showed left upper lobe lung nodule measuring 1.7 cm with SUV of 4.2.  Single focus of metabolic activity within the mid vertebral body at T12 level.  SUV 5.6. -Quit smoking 9 years ago.  Smoked 1 to 2 packs a day for 20 years. -MRI of the T-spine with contrast on 12/14/2018 did not show any evidence of metastatic disease.  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 -MRI of the brain on 12/14/2018 did not show any evidence of metastatic disease. -She underwent a wedge resection of the left upper lobe lung nodule by Dr. Kipp Brood.  This showed benign lung parenchyma with dense inflammation including microabscess.  No evidence of malignancy. -The pathology was discussed in detail with the patient. -Chest x-ray done on 04/04/2019 showed currently no pleural  effusions evident.  Mild scarring left midlung.  No edema or consolidation.  No adenopathy. -Patient will follow-up in 6 months.  2.  Health maintenance: -Mammogram on 09/14/2018 was BI-RADS Category 1 -Colonoscopy on 03/21/2018  showed 4 to 6 mm polyps, 7 in number in the sigmoid colon, in the descending colon at the splenic flexure, in the transverse colon and at the hepatic flexure.  Diverticulosis in the sigmoid colon.  Pathology consistent with tubular adenoma, sessile serrated polyp without dysplasia, hyperplastic polyp.      Orders placed this encounter:  No orders of the defined types were placed in this encounter.    Francene Finders, FNP-C [place] [phone]

## 2019-04-06 NOTE — Patient Instructions (Signed)
[facility] at [facility] Discharge Instructions  Follow up in 6 months with labs    Thank you for choosing  [facility] Center at [facility] to provide your oncology and hematology care.  To afford each patient quality time with our provider, please arrive at least 15 minutes before your scheduled appointment time.   If you have a lab appointment with the Cancer Center please come in thru the Main Entrance and check in at the main information desk.  You need to re-schedule your appointment should you arrive 10 or more minutes late.  We strive to give you quality time with our providers, and arriving late affects you and other patients whose appointments are after yours.  Also, if you no show three or more times for appointments you may be dismissed from the clinic at the providers discretion.     Again, thank you for choosing [facility].  Our hope is that these requests will decrease the amount of time that you wait before being seen by our physicians.       _____________________________________________________________  Should you have questions after your visit to  Cancer Center, please contact our office at [phone] between the hours of 8:00 a.m. and 4:30 p.m.  Voicemails left after 4:00 p.m. will not be returned until the following business day.  For prescription refill requests, have your pharmacy contact our office and allow 72 hours.    Due to Covid, you will need to wear a mask upon entering the hospital. If you do not have a mask, a mask will be given to you at the Main Entrance upon arrival. For doctor visits, patients may have 1 support person with them. For treatment visits, patients can not have anyone with them due to social distancing guidelines and our immunocompromised population.      

## 2019-04-06 NOTE — Assessment & Plan Note (Addendum)
1.  Left upper lobe lung nodule: -Patient felt pain in the left chest wall, went to the ER on 11/10/2018, chest x-ray showed abnormal findings. -PET scan on 11/23/2018 showed left upper lobe lung nodule measuring 1.7 cm with SUV of 4.2.  Single focus of metabolic activity within the mid vertebral body at T12 level.  SUV 5.6. -Quit smoking 9 years ago.  Smoked 1 to 2 packs a day for 20 years. -MRI of the T-spine with contrast on 12/14/2018 did not show any evidence of metastatic disease.  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 -MRI of the brain on 12/14/2018 did not show any evidence of metastatic disease. -She underwent a wedge resection of the left upper lobe lung nodule by Dr. Kipp Brood.  This showed benign lung parenchyma with dense inflammation including microabscess.  No evidence of malignancy. -The pathology was discussed in detail with the patient. -Chest x-ray done on 04/04/2019 showed currently no pleural effusions evident.  Mild scarring left midlung.  No edema or consolidation.  No adenopathy. -Patient will follow-up in 6 months.  2.  Health maintenance: -Mammogram on 09/14/2018 was BI-RADS Category 1 -Colonoscopy on 03/21/2018 showed 4 to 6 mm polyps, 7 in number in the sigmoid colon, in the descending colon at the splenic flexure, in the transverse colon and at the hepatic flexure.  Diverticulosis in the sigmoid colon.  Pathology consistent with tubular adenoma, sessile serrated polyp without dysplasia, hyperplastic polyp.

## 2019-05-24 LAB — BLOOD GAS, ARTERIAL
Acid-Base Excess: 2.1 mmol/L — ABNORMAL HIGH (ref 0.0–2.0)
Bicarbonate: 27.6 mmol/L (ref 20.0–28.0)
O2 Saturation: 99.1 %
Patient temperature: 98.6
pCO2 arterial: 54.4 mmHg — ABNORMAL HIGH (ref 32.0–48.0)
pH, Arterial: 7.325 — ABNORMAL LOW (ref 7.350–7.450)
pO2, Arterial: 174 mmHg — ABNORMAL HIGH (ref 83.0–108.0)

## 2019-10-05 ENCOUNTER — Inpatient Hospital Stay (HOSPITAL_COMMUNITY): Payer: PRIVATE HEALTH INSURANCE | Attending: Nurse Practitioner | Admitting: Nurse Practitioner

## 2019-10-05 DIAGNOSIS — R911 Solitary pulmonary nodule: Secondary | ICD-10-CM

## 2019-10-05 DIAGNOSIS — I10 Essential (primary) hypertension: Secondary | ICD-10-CM | POA: Insufficient documentation

## 2019-10-05 DIAGNOSIS — K589 Irritable bowel syndrome without diarrhea: Secondary | ICD-10-CM | POA: Insufficient documentation

## 2019-10-05 DIAGNOSIS — Z87891 Personal history of nicotine dependence: Secondary | ICD-10-CM | POA: Diagnosis not present

## 2019-10-05 DIAGNOSIS — K219 Gastro-esophageal reflux disease without esophagitis: Secondary | ICD-10-CM | POA: Diagnosis not present

## 2019-10-05 DIAGNOSIS — R918 Other nonspecific abnormal finding of lung field: Secondary | ICD-10-CM | POA: Insufficient documentation

## 2019-10-05 DIAGNOSIS — Z79899 Other long term (current) drug therapy: Secondary | ICD-10-CM | POA: Insufficient documentation

## 2019-10-05 DIAGNOSIS — Z8349 Family history of other endocrine, nutritional and metabolic diseases: Secondary | ICD-10-CM | POA: Insufficient documentation

## 2019-10-05 DIAGNOSIS — Z7984 Long term (current) use of oral hypoglycemic drugs: Secondary | ICD-10-CM | POA: Insufficient documentation

## 2019-10-05 DIAGNOSIS — Z803 Family history of malignant neoplasm of breast: Secondary | ICD-10-CM | POA: Diagnosis not present

## 2019-10-05 DIAGNOSIS — E119 Type 2 diabetes mellitus without complications: Secondary | ICD-10-CM | POA: Insufficient documentation

## 2019-10-05 DIAGNOSIS — Z8249 Family history of ischemic heart disease and other diseases of the circulatory system: Secondary | ICD-10-CM | POA: Diagnosis not present

## 2019-10-05 DIAGNOSIS — Z833 Family history of diabetes mellitus: Secondary | ICD-10-CM | POA: Insufficient documentation

## 2019-10-05 NOTE — Assessment & Plan Note (Signed)
1.  Left upper lobe lung nodule: -Patient felt pain in the left chest wall, went to the ER on 11/10/2018, chest x-ray showed abnormal findings. -PET scan on 11/23/2018 showed left upper lobe lung nodule measuring 1.7 cm with SUV of 4.2.  Single focus of metabolic activity within the mid vertebral body at T12 level.  SUV 5.6. -Quit smoking 9 years ago.  Smoked 1 to 2 packs a day for 20 years. -MRI of the T-spine with contrast on 12/14/2018 did not show any evidence of metastatic disease.  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 -MRI of the brain on 12/14/2018 did not show any evidence of metastatic disease. -She underwent a wedge resection of the left upper lobe lung nodule by Dr. Kipp Brood.  This showed benign lung parenchyma with dense inflammation including microabscess.  No evidence of malignancy. -The pathology was discussed in detail with the patient. -Chest x-ray done on 04/04/2019 showed currently no pleural effusions evident.  Mild scarring left midlung.  No edema or consolidation.  No adenopathy. -No new shortness of breath or cough.  No new event since last visit. -Patient will follow-up in 6 months with labs.  2.  Health maintenance: -Mammogram on 09/14/2018 was BI-RADS Category 1 -Colonoscopy on 03/21/2018 showed 4 to 6 mm polyps, 7 in number in the sigmoid colon, in the descending colon at the splenic flexure, in the transverse colon and at the hepatic flexure.  Diverticulosis in the sigmoid colon.  Pathology consistent with tubular adenoma, sessile serrated polyp without dysplasia, hyperplastic polyp.

## 2019-10-05 NOTE — Progress Notes (Signed)
[name], [place] [address]   CLINIC:  Medical Oncology/Hematology  PCP:  Caryl Bis, MD [place] [address] [phone]   REASON FOR VISIT: Follow-up for benign left [place] nodule  CURRENT THERAPY: Surgical removal observation   INTERVAL HISTORY:  Tanya [place] 54 y.o. female returns for routine follow-up for benign left [place] nodule.  Patient reports she has been doing well since her last visit.  She reports some shortness of breath upon attempting exercise.  Otherwise she is doing well.  No new cough. Denies any nausea, vomiting, or diarrhea. Denies any new pains. Had not noticed any recent bleeding such as epistaxis, hematuria or hematochezia. Denies recent chest pain on exertion, shortness of breath on minimal exertion, pre-syncopal episodes, or palpitations. Denies any numbness or tingling in hands or feet. Denies any recent fevers, infections, or recent hospitalizations. Patient reports appetite at 75% and energy level at 75%.  She is eating well maintain her weight at this time.    REVIEW OF SYSTEMS:  Review of Systems  Respiratory: Positive for shortness of breath (With exertion).   All other systems reviewed and are negative.    PAST MEDICAL/SURGICAL HISTORY:  Past Medical History:  Diagnosis Date  . Diabetes mellitus without complication ([place])   . Diverticulosis   . Fatty liver   . GERD (gastroesophageal reflux disease)   . H/O seasonal allergies   . Hemorrhoids   . Hypertension   . Irritable bowel syndrome    Past Surgical History:  Procedure Laterality Date  . COLONOSCOPY N/A 03/11/2018   Procedure: COLONOSCOPY;  Surgeon: Rogene Houston, MD;  [facility];  Service: Endoscopy;  Laterality: N/A;  930  . KNEE ARTHROSCOPY  2011   left knee  . LOBECTOMY Left 01/11/2019   Procedure: possible LOBECTOMY;  Surgeon: Lajuana Matte, MD;  Location: [facility];  Service: Thoracic;  Laterality: Left;  .  POLYPECTOMY  03/11/2018   Procedure: POLYPECTOMY;  Surgeon: Rogene Houston, MD;  [facility];  Service: Endoscopy;;  splenic flexure (CSx1), transverse colon (CBx1), hepatic flexure (CSx1), descending colon (CSx1)distal sigmoid colon(CSx3)  . VIDEO ASSISTED THORACOSCOPY (VATS)/WEDGE RESECTION Left 01/11/2019   Procedure: VIDEO ASSISTED THORACOSCOPY (VATS)/LEFT UPPER LOBE WEDGE RESECTION;  Surgeon: Lajuana Matte, MD;  Location: [facility];  Service: Thoracic;  Laterality: Left;  Marland Kitchen VIDEO BRONCHOSCOPY N/A 01/11/2019   Procedure: VIDEO BRONCHOSCOPY;  Surgeon: Lajuana Matte, MD;  Location: [facility];  Service: Thoracic;  Laterality: N/A;     SOCIAL HISTORY:  Social History   Socioeconomic History  . Marital status: Married    Spouse name: Tanya [place].  . Number of children: Not on file  . Years of education: Not on file  . Highest education level: Not on file  Occupational History  . Not on file  Tobacco Use  . Smoking status: Former Smoker    Types: Cigarettes    Quit date: 09/15/2009    Years since quitting: 10.0  . Smokeless tobacco: Never Used  . Tobacco comment: Patient smoked for 25 years, 1 1/2 ppd  Vaping Use  . Vaping Use: Never used  Substance and Sexual Activity  . Alcohol use: Yes    Comment: Twice a Year  . Drug use: No  . Sexual activity: Not on file  Other Topics Concern  . Not on file  Social History Narrative  . Not on file   Social Determinants  of Health   Financial Resource Strain: Low Risk   . Difficulty of Paying Living Expenses: Not hard at all  Food Insecurity: No Food Insecurity  . Worried About Charity fundraiser in the Last Year: Never true  . Ran Out of Food in the Last Year: Never true  Transportation Needs: No Transportation Needs  . Lack of Transportation (Medical): No  . Lack of Transportation (Non-Medical): No  Physical Activity: Insufficiently Active  . Days of Exercise per Week: 1 day  . Minutes of Exercise per Session:  10 min  Stress: No Stress Concern Present  . Feeling of Stress : Only a little  Social Connections: Moderately Integrated  . Frequency of Communication with Friends and Family: More than three times a week  . Frequency of Social Gatherings with Friends and Family: More than three times a week  . Attends Religious Services: More than 4 times per year  . Active Member of Clubs or Organizations: No  . Attends Archivist Meetings: Never  . Marital Status: Married  Human resources officer Violence: Not At Risk  . Fear of Current or Ex-Partner: No  . Emotionally Abused: No  . Physically Abused: No  . Sexually Abused: No    FAMILY HISTORY:  Family History  Problem Relation Age of Onset  . Diabetes Mother   . Thyroid disease Mother   . Hypertension Mother   . Hypertension Father   . Hypertension Brother   . Healthy Brother   . Breast cancer Maternal Grandmother   . Colon cancer Neg Hx     CURRENT MEDICATIONS:  Outpatient Encounter Medications as of 10/05/2019  Medication Sig  . dicyclomine (BENTYL) 10 MG capsule Take 1 capsule (10 mg total) by mouth 2 (two) times daily before a meal.  . Lancets (ONETOUCH DELICA PLUS [id number]) MISC USE TO TEST FASTING BLOOD SUGAR D  . lisinopril-hydrochlorothiazide (ZESTORETIC) 20-12.5 MG tablet Take 1 tablet by mouth daily.  . metFORMIN (GLUCOPHAGE-XR) 500 MG 24 hr tablet Take 500-1,000 mg by mouth See admin instructions. Take 1 tablet (500 mg) in the morning & 2 tablets (1000 mg) by mouth in the evening.  [name] VERIO test strip 1 each by Other route daily.   . [DISCONTINUED] doxycycline (VIBRAMYCIN) 100 MG capsule Take 1 capsule (100 mg total) by mouth 2 (two) times daily.  Marland Kitchen acetaminophen (TYLENOL) 500 MG tablet Take 2 tablets (1,000 mg total) by mouth every 6 (six) hours. (Patient not taking: Reported on 10/05/2019)  . clobetasol (TEMOVATE) 0.05 % external solution Apply 1 application topically 2 (two) times daily as needed.  (Patient not  taking: Reported on 10/05/2019)  . esomeprazole (NEXIUM) 20 MG capsule Take 20 mg by mouth daily as needed (heartburn).  (Patient not taking: Reported on 10/05/2019)  . guaiFENesin (MUCINEX) 600 MG 12 hr tablet Take 600 mg by mouth 2 (two) times daily as needed. (Patient not taking: Reported on 10/05/2019)  . traMADol (ULTRAM) 50 MG tablet Take 50 mg by mouth every 6 (six) hours as needed. (Patient not taking: Reported on 10/05/2019)   No facility-administered encounter medications on file as of 10/05/2019.    ALLERGIES:  Allergies  Allergen Reactions  . Sulfa Antibiotics Nausea Only and Swelling    SWELLING REACTION UNSPECIFIED      PHYSICAL EXAM:  ECOG Performance status: 1  Vitals:   10/05/19 1421  BP: 140/80  Pulse: 96  Resp: 18  Temp: (!) 97.5 F (36.4 C)  SpO2: 98%  There were no vitals filed for this visit. Physical Exam Constitutional:      Appearance: She is obese.  Cardiovascular:     Rate and Rhythm: Normal rate and regular rhythm.     Heart sounds: Normal heart sounds.  Pulmonary:     Effort: Pulmonary effort is normal.     Breath sounds: Normal breath sounds.  Abdominal:     General: Bowel sounds are normal.     Palpations: Abdomen is soft.  Musculoskeletal:        General: Normal range of motion.  Skin:    General: Skin is warm.  Neurological:     Mental Status: She is alert and oriented to person, place, and time. Mental status is at baseline.  Psychiatric:        Mood and Affect: Mood normal.        Behavior: Behavior normal.        Thought Content: Thought content normal.        Judgment: Judgment normal.      LABORATORY DATA:  I have reviewed the labs as listed.  CBC    Component Value Date/Time   WBC 6.5 02/17/2019 1328   RBC 5.00 02/17/2019 1328   HGB 13.6 02/17/2019 1328   HCT 42.5 02/17/2019 1328   PLT 282 02/17/2019 1328   MCV 85.0 02/17/2019 1328   MCH 27.2 02/17/2019 1328   MCHC 32.0 02/17/2019 1328   RDW 14.1 02/17/2019 1328     LYMPHSABS 1.2 02/17/2019 1328   MONOABS 0.4 02/17/2019 1328   EOSABS 0.1 02/17/2019 1328   BASOSABS 0.1 02/17/2019 1328   CMP Latest Ref Rng & Units 02/17/2019 01/12/2019 01/11/2019  Glucose 70 - 99 mg/dL 184(H) 130(H) -  BUN 6 - 20 mg/dL 15 15 -  Creatinine 0.44 - 1.00 mg/dL 0.87 0.94 -  Sodium 135 - 145 mmol/L 136 134(L) 136  Potassium 3.5 - 5.1 mmol/L 3.9 3.5 4.1  Chloride 98 - 111 mmol/L 101 101 -  CO2 22 - 32 mmol/L 25 24 -  Calcium 8.9 - 10.3 mg/dL 8.7(L) 7.4(L) -  Total Protein 6.5 - 8.1 g/dL 6.4(L) - -  Total Bilirubin 0.3 - 1.2 mg/dL 0.8 - -  Alkaline Phos 38 - 126 U/L 90 - -  AST 15 - 41 U/L 23 - -  ALT 0 - 44 U/L 29 - -    All questions were answered to patient's stated satisfaction. Encouraged patient to call with any new concerns or questions before his next visit to the cancer center and we can certain see him sooner, if needed.     ASSESSMENT & PLAN:  Nodule of left [place] 1.  Left upper lobe [place] nodule: -Patient felt pain in the left chest wall, went to the ER on 11/10/2018, chest x-ray showed abnormal findings. -PET scan on 11/23/2018 showed left upper lobe [place] nodule measuring 1.7 cm with SUV of 4.2.  Single focus of metabolic activity within the mid vertebral body at T12 level.  SUV 5.6. -Quit smoking 9 years ago.  Smoked 1 to 2 packs a day for 20 years. -MRI of the T-spine with contrast on 12/14/2018 did not show any evidence of metastatic disease.  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 -MRI of the brain on 12/14/2018 did not show any evidence of metastatic disease. -She underwent a wedge resection of the left upper lobe [place] nodule by Dr. Kipp Brood.  This showed benign [place] parenchyma with dense  inflammation including microabscess.  No evidence of malignancy. -The pathology was discussed in detail with the patient. -Chest x-ray done on 04/04/2019 showed currently no pleural effusions evident.  Mild scarring left midlung.  No edema or  consolidation.  No adenopathy. -No new shortness of breath or cough.  No new event since last visit. -Patient will follow-up in 6 months with labs.  2.  Health maintenance: -Mammogram on 09/14/2018 was BI-RADS Category 1 -Colonoscopy on 03/21/2018 showed 4 to 6 mm polyps, 7 in number in the sigmoid colon, in the descending colon at the splenic flexure, in the transverse colon and at the hepatic flexure.  Diverticulosis in the sigmoid colon.  Pathology consistent with tubular adenoma, sessile serrated polyp without dysplasia, hyperplastic polyp.     Orders placed this encounter:  Orders Placed This Encounter  Procedures  . Lactate dehydrogenase  . CBC with Differential/Platelet  . Comprehensive metabolic panel  . Vitamin B12  . VITAMIN D 25 Hydroxy (Vit-D Deficiency, Fractures)      Francene Finders, FNP-C [place] [phone]

## 2019-10-05 NOTE — Patient Instructions (Signed)
[facility] at [facility] Discharge Instructions  Follow up in 6 months with labs    Thank you for choosing [facility] at [facility] to provide your oncology and hematology care.  To afford each patient quality time with our provider, please arrive at least 15 minutes before your scheduled appointment time.   If you have a lab appointment with the Cancer Center please come in thru the Main Entrance and check in at the main information desk.  You need to re-schedule your appointment should you arrive 10 or more minutes late.  We strive to give you quality time with our providers, and arriving late affects you and other patients whose appointments are after yours.  Also, if you no show three or more times for appointments you may be dismissed from the clinic at the providers discretion.     Again, thank you for choosing [facility].  Our hope is that these requests will decrease the amount of time that you wait before being seen by our physicians.       _____________________________________________________________  Should you have questions after your visit to  Cancer Center, please contact our office at [phone] between the hours of 8:00 a.m. and 4:30 p.m.  Voicemails left after 4:00 p.m. will not be returned until the following business day.  For prescription refill requests, have your pharmacy contact our office and allow 72 hours.    Due to Covid, you will need to wear a mask upon entering the hospital. If you do not have a mask, a mask will be given to you at the Main Entrance upon arrival. For doctor visits, patients may have 1 support person with them. For treatment visits, patients can not have anyone with them due to social distancing guidelines and our immunocompromised population.      

## 2019-10-17 ENCOUNTER — Other Ambulatory Visit: Payer: Self-pay | Admitting: Family Medicine

## 2019-10-17 DIAGNOSIS — Z1231 Encounter for screening mammogram for malignant neoplasm of breast: Secondary | ICD-10-CM

## 2019-10-24 ENCOUNTER — Ambulatory Visit
Admission: RE | Admit: 2019-10-24 | Discharge: 2019-10-24 | Disposition: A | Discharge status: Home / Self Care | Payer: PRIVATE HEALTH INSURANCE | Source: Ambulatory Visit | Attending: Family Medicine | Admitting: Family Medicine

## 2019-10-24 ENCOUNTER — Other Ambulatory Visit: Payer: Self-pay

## 2019-10-24 DIAGNOSIS — Z1231 Encounter for screening mammogram for malignant neoplasm of breast: Secondary | ICD-10-CM

## 2020-02-13 ENCOUNTER — Other Ambulatory Visit: Payer: PRIVATE HEALTH INSURANCE

## 2020-02-13 DIAGNOSIS — Z20822 Contact with and (suspected) exposure to covid-19: Secondary | ICD-10-CM

## 2020-02-14 LAB — SARS-COV-2, NAA 2 DAY TAT

## 2020-02-14 LAB — NOVEL CORONAVIRUS, NAA: SARS-CoV-2, NAA: NOT DETECTED

## 2020-04-24 ENCOUNTER — Other Ambulatory Visit (HOSPITAL_COMMUNITY): Payer: Self-pay | Admitting: *Deleted

## 2020-04-24 DIAGNOSIS — R911 Solitary pulmonary nodule: Secondary | ICD-10-CM

## 2020-04-24 DIAGNOSIS — J90 Pleural effusion, not elsewhere classified: Secondary | ICD-10-CM

## 2020-04-25 ENCOUNTER — Other Ambulatory Visit: Payer: Self-pay

## 2020-04-25 ENCOUNTER — Inpatient Hospital Stay (HOSPITAL_COMMUNITY): Payer: PRIVATE HEALTH INSURANCE | Attending: Hematology

## 2020-04-25 DIAGNOSIS — E559 Vitamin D deficiency, unspecified: Secondary | ICD-10-CM | POA: Diagnosis not present

## 2020-04-25 DIAGNOSIS — R109 Unspecified abdominal pain: Secondary | ICD-10-CM | POA: Insufficient documentation

## 2020-04-25 DIAGNOSIS — R0789 Other chest pain: Secondary | ICD-10-CM | POA: Diagnosis not present

## 2020-04-25 DIAGNOSIS — Z79899 Other long term (current) drug therapy: Secondary | ICD-10-CM | POA: Diagnosis not present

## 2020-04-25 DIAGNOSIS — R911 Solitary pulmonary nodule: Secondary | ICD-10-CM

## 2020-04-25 DIAGNOSIS — J90 Pleural effusion, not elsewhere classified: Secondary | ICD-10-CM

## 2020-04-25 DIAGNOSIS — I10 Essential (primary) hypertension: Secondary | ICD-10-CM | POA: Insufficient documentation

## 2020-04-25 DIAGNOSIS — Z8719 Personal history of other diseases of the digestive system: Secondary | ICD-10-CM | POA: Diagnosis not present

## 2020-04-25 DIAGNOSIS — E119 Type 2 diabetes mellitus without complications: Secondary | ICD-10-CM | POA: Diagnosis not present

## 2020-04-25 DIAGNOSIS — Z7984 Long term (current) use of oral hypoglycemic drugs: Secondary | ICD-10-CM | POA: Insufficient documentation

## 2020-04-25 DIAGNOSIS — R918 Other nonspecific abnormal finding of lung field: Secondary | ICD-10-CM | POA: Diagnosis present

## 2020-04-25 DIAGNOSIS — Z87891 Personal history of nicotine dependence: Secondary | ICD-10-CM | POA: Insufficient documentation

## 2020-04-25 DIAGNOSIS — K760 Fatty (change of) liver, not elsewhere classified: Secondary | ICD-10-CM | POA: Diagnosis not present

## 2020-04-25 LAB — CBC WITH DIFFERENTIAL/PLATELET
Abs Immature Granulocytes: 0.03 10*3/uL (ref 0.00–0.07)
Basophils Absolute: 0.1 10*3/uL (ref 0.0–0.1)
Basophils Relative: 1 %
Eosinophils Absolute: 0.2 10*3/uL (ref 0.0–0.5)
Eosinophils Relative: 3 %
HCT: 40.3 % (ref 36.0–46.0)
Hemoglobin: 13 g/dL (ref 12.0–15.0)
Immature Granulocytes: 1 %
Lymphocytes Relative: 23 %
Lymphs Abs: 1.4 10*3/uL (ref 0.7–4.0)
MCH: 27.5 pg (ref 26.0–34.0)
MCHC: 32.3 g/dL (ref 30.0–36.0)
MCV: 85.2 fL (ref 80.0–100.0)
Monocytes Absolute: 0.4 10*3/uL (ref 0.1–1.0)
Monocytes Relative: 7 %
Neutro Abs: 4 10*3/uL (ref 1.7–7.7)
Neutrophils Relative %: 65 %
Platelets: 237 10*3/uL (ref 150–400)
RBC: 4.73 MIL/uL (ref 3.87–5.11)
RDW: 13.9 % (ref 11.5–15.5)
WBC: 6 10*3/uL (ref 4.0–10.5)
nRBC: 0 % (ref 0.0–0.2)

## 2020-04-25 LAB — COMPREHENSIVE METABOLIC PANEL
ALT: 45 U/L — ABNORMAL HIGH (ref 0–44)
AST: 30 U/L (ref 15–41)
Albumin: 3.7 g/dL (ref 3.5–5.0)
Alkaline Phosphatase: 69 U/L (ref 38–126)
Anion gap: 10 (ref 5–15)
BUN: 18 mg/dL (ref 6–20)
CO2: 25 mmol/L (ref 22–32)
Calcium: 8.7 mg/dL — ABNORMAL LOW (ref 8.9–10.3)
Chloride: 100 mmol/L (ref 98–111)
Creatinine, Ser: 0.91 mg/dL (ref 0.44–1.00)
GFR, Estimated: >60 mL/min (ref >60)
Glucose, Bld: 184 mg/dL — ABNORMAL HIGH (ref 70–99)
Potassium: 4 mmol/L (ref 3.5–5.1)
Sodium: 135 mmol/L (ref 135–145)
Total Bilirubin: 0.4 mg/dL (ref 0.3–1.2)
Total Protein: 6.5 g/dL (ref 6.5–8.1)

## 2020-04-25 LAB — LACTATE DEHYDROGENASE: LDH: 114 U/L (ref 98–192)

## 2020-04-25 LAB — VITAMIN D 25 HYDROXY (VIT D DEFICIENCY, FRACTURES): Vit D, 25-Hydroxy: 14.63 ng/mL — ABNORMAL LOW (ref 30–100)

## 2020-04-25 LAB — VITAMIN B12: Vitamin B-12: 330 pg/mL (ref 180–914)

## 2020-05-02 ENCOUNTER — Inpatient Hospital Stay (HOSPITAL_BASED_OUTPATIENT_CLINIC_OR_DEPARTMENT_OTHER): Payer: PRIVATE HEALTH INSURANCE | Admitting: Hematology

## 2020-05-02 ENCOUNTER — Other Ambulatory Visit: Payer: Self-pay

## 2020-05-02 VITALS — BP 122/71 | HR 93 | Temp 97.2°F | Resp 20 | Wt 310.1 lb

## 2020-05-02 DIAGNOSIS — R911 Solitary pulmonary nodule: Secondary | ICD-10-CM | POA: Diagnosis not present

## 2020-05-02 DIAGNOSIS — E119 Type 2 diabetes mellitus without complications: Secondary | ICD-10-CM | POA: Diagnosis not present

## 2020-05-02 NOTE — Patient Instructions (Signed)
[place] at [facility] Discharge Instructions  You were seen today by Dr. Delton Coombes. He went over your recent results and scans. You will be scheduled for a CT scan of your chest before your next visit. Start taking vitamin D 5,000 units daily. Dr. Delton Coombes will see you back in 3 months for labs and follow up.   Thank you for choosing [place] at [facility] to provide your oncology and hematology care.  To afford each patient quality time with our provider, please arrive at least 15 minutes before your scheduled appointment time.   If you have a lab appointment with the [place] please come in thru the Main Entrance and check in at the main information desk  You need to re-schedule your appointment should you arrive 10 or more minutes late.  We strive to give you quality time with our providers, and arriving late affects you and other patients whose appointments are after yours.  Also, if you no show three or more times for appointments you may be dismissed from the clinic at the providers discretion.     Again, thank you for choosing [facility].  Our hope is that these requests will decrease the amount of time that you wait before being seen by our physicians.       _____________________________________________________________  Should you have questions after your visit to [facility], please contact our office at [phone] between the hours of 8:00 a.m. and 4:30 p.m.  Voicemails left after 4:00 p.m. will not be returned until the following business day.  For prescription refill requests, have your pharmacy contact our office and allow 72 hours.    Cancer Center Support Programs:   > Cancer Support Group  2nd Tuesday of the month 1pm-2pm, Journey Room

## 2020-05-02 NOTE — Progress Notes (Signed)
[place], [place] [address]   CLINIC:  Medical Oncology/Hematology  PCP:  Caryl Bis, MD [address] [place] [address] [phone]   REASON FOR VISIT:  Follow-up for left lung nodule  PRIOR THERAPY: VATS with left upper lobe resection on 01/11/2019  NGS Results: Not done  CURRENT THERAPY: Observation  BRIEF ONCOLOGIC HISTORY:  Oncology History   No history exists.    CANCER STAGING: Cancer Staging No matching staging information was found for the patient.  INTERVAL HISTORY:  Ms. Tanya Galloway, a 55 y.o. female, returns for routine follow-up of her left lung nodule. Tulsi was last seen by Francene Finders, NP, on 10/05/2019.   Today she reports feeling okay. She currently taking metformin 2 tablets BID for her DM. She recently started applying ketoconazole and Taclonex suspension on her scalp for psoriasis. She complains of left flank pain since having the VATS. She started taking vitamin D 2,000 units on 04/26/2020. She denies having nosebleeds, hematuria or hematochezia.   REVIEW OF SYSTEMS:  Review of Systems  Constitutional: Positive for fatigue (depleted). Negative for appetite change.  HENT:   Negative for nosebleeds.   Gastrointestinal: Positive for abdominal pain (4/10 flank pain). Negative for blood in stool.  Genitourinary: Negative for hematuria.   All other systems reviewed and are negative.   PAST MEDICAL/SURGICAL HISTORY:  Past Medical History:  Diagnosis Date  . Diabetes mellitus without complication ([place])   . Diverticulosis   . Fatty liver   . GERD (gastroesophageal reflux disease)   . H/O seasonal allergies   . Hemorrhoids   . Hypertension   . Irritable bowel syndrome    Past Surgical History:  Procedure Laterality Date  . COLONOSCOPY N/A 03/11/2018   Procedure: COLONOSCOPY;  Surgeon: Rogene Houston, MD;  Location: [facility];  Service: Endoscopy;  Laterality: N/A;  930  . KNEE ARTHROSCOPY   2011   left knee  . LOBECTOMY Left 01/11/2019   Procedure: possible LOBECTOMY;  Surgeon: Lajuana Matte, MD;  Location: [facility];  Service: Thoracic;  Laterality: Left;  . POLYPECTOMY  03/11/2018   Procedure: POLYPECTOMY;  Surgeon: Rogene Houston, MD;  Location: [facility];  Service: Endoscopy;;  splenic flexure (CSx1), transverse colon (CBx1), hepatic flexure (CSx1), descending colon (CSx1)distal sigmoid colon(CSx3)  . VIDEO ASSISTED THORACOSCOPY (VATS)/WEDGE RESECTION Left 01/11/2019   Procedure: VIDEO ASSISTED THORACOSCOPY (VATS)/LEFT UPPER LOBE WEDGE RESECTION;  Surgeon: Lajuana Matte, MD;  Location: [facility];  Service: Thoracic;  Laterality: Left;  Marland Kitchen VIDEO BRONCHOSCOPY N/A 01/11/2019   Procedure: VIDEO BRONCHOSCOPY;  Surgeon: Lajuana Matte, MD;  Location: [facility];  Service: Thoracic;  Laterality: N/A;    SOCIAL HISTORY:  Social History   Socioeconomic History  . Marital status: Married    Spouse name: Omah Dewalt.  . Number of children: Not on file  . Years of education: Not on file  . Highest education level: Not on file  Occupational History  . Not on file  Tobacco Use  . Smoking status: Former Smoker    Types: Cigarettes    Quit date: 09/15/2009    Years since quitting: 10.6  . Smokeless tobacco: Never Used  . Tobacco comment: Patient smoked for 25 years, 1 1/2 ppd  Vaping Use  . Vaping Use: Never used  Substance and Sexual Activity  . Alcohol use: Yes    Comment: Twice a Year  . Drug use: No  .  Sexual activity: Not on file  Other Topics Concern  . Not on file  Social History Narrative  . Not on file   Social Determinants of Health   Financial Resource Strain: Not on file  Food Insecurity: Not on file  Transportation Needs: Not on file  Physical Activity: Not on file  Stress: Not on file  Social Connections: Not on file  Intimate Partner Violence: Not on file    FAMILY HISTORY:  Family History  Problem Relation Age of Onset  . Diabetes  Mother   . Thyroid disease Mother   . Hypertension Mother   . Hypertension Father   . Hypertension Brother   . Healthy Brother   . Breast cancer Maternal Grandmother   . Colon cancer Neg Hx     CURRENT MEDICATIONS:  Current Outpatient Medications  Medication Sig Dispense Refill  . acetaminophen (TYLENOL) 500 MG tablet Take 2 tablets (1,000 mg total) by mouth every 6 (six) hours. 30 tablet 0  . calcipotriene-betamethasone (TACLONEX SCALP) external suspension Apply 6 drops topically daily.    . Cholecalciferol (VITAMIN D3) 50 MCG (2000 UT) TABS Take 1 tablet by mouth daily.    . clobetasol (TEMOVATE) 0.05 % external solution Apply 1 application topically 2 (two) times daily as needed.    . Continuous Blood Gluc Sensor (FREESTYLE LIBRE 14 DAY SENSOR) MISC SMARTSIG:1 Topical Every 2 Weeks    . dicyclomine (BENTYL) 10 MG capsule Take 1 capsule (10 mg total) by mouth 2 (two) times daily before a meal. 180 capsule 3  . esomeprazole (NEXIUM) 20 MG capsule Take 20 mg by mouth daily as needed (heartburn).    Marland Kitchen guaiFENesin (MUCINEX) 600 MG 12 hr tablet Take 600 mg by mouth 2 (two) times daily as needed.    Marland Kitchen ketoconazole (NIZORAL) 2 % shampoo Apply 1 application topically 2 (two) times a week.    . Lancets (ONETOUCH DELICA PLUS MPNTIR44R) MISC USE TO TEST FASTING BLOOD SUGAR D    . lisinopril-hydrochlorothiazide (ZESTORETIC) 20-12.5 MG tablet Take 1 tablet by mouth daily.    . metFORMIN (GLUCOPHAGE-XR) 500 MG 24 hr tablet Take 500-1,000 mg by mouth See admin instructions. Take 1 tablet (500 mg) in the morning & 2 tablets (1000 mg) by mouth in the evening.  2  . ONETOUCH VERIO test strip 1 each by Other route daily.     . traMADol (ULTRAM) 50 MG tablet Take 50 mg by mouth every 6 (six) hours as needed.     No current facility-administered medications for this visit.    ALLERGIES:  Allergies  Allergen Reactions  . Sulfa Antibiotics Nausea Only and Swelling    SWELLING REACTION UNSPECIFIED      PHYSICAL EXAM:  Performance status (ECOG): 1 - Symptomatic but completely ambulatory  Vitals:   05/02/20 1453  BP: 122/71  Pulse: 93  Resp: 20  Temp: (!) 97.2 F (36.2 C)  SpO2: (!) 9%   Wt Readings from Last 3 Encounters:  05/02/20 (!) 310 lb 1.6 oz (140.7 kg)  04/06/19 (!) 304 lb 3.2 oz (138 kg)  02/21/19 (!) 312 lb (141.5 kg)   Physical Exam Vitals reviewed.  Constitutional:      Appearance: Normal appearance. She is obese.  Cardiovascular:     Rate and Rhythm: Normal rate and regular rhythm.     Pulses: Normal pulses.     Heart sounds: Normal heart sounds.  Pulmonary:     Effort: Pulmonary effort is normal.     Breath  sounds: Normal breath sounds.  Abdominal:     Palpations: Abdomen is soft. There is no hepatomegaly, splenomegaly or mass.     Tenderness: There is no abdominal tenderness.     Hernia: No hernia is present.  Musculoskeletal:     Right lower leg: No edema.     Left lower leg: No edema.  Neurological:     General: No focal deficit present.     Mental Status: She is alert and oriented to person, place, and time.  Psychiatric:        Mood and Affect: Mood normal.        Behavior: Behavior normal.      LABORATORY DATA:  I have reviewed the labs as listed.  CBC Latest Ref Rng & Units 04/25/2020 02/17/2019 01/12/2019  WBC 4.0 - 10.5 K/uL 6.0 6.5 8.1  Hemoglobin 12.0 - 15.0 g/dL 13.0 13.6 11.4(L)  Hematocrit 36.0 - 46.0 % 40.3 42.5 34.3(L)  Platelets 150 - 400 K/uL 237 282 211   CMP Latest Ref Rng & Units 04/25/2020 02/17/2019 01/12/2019  Glucose 70 - 99 mg/dL 184(H) 184(H) 130(H)  BUN 6 - 20 mg/dL 18 15 15   Creatinine 0.44 - 1.00 mg/dL 0.91 0.87 0.94  Sodium 135 - 145 mmol/L 135 136 134(L)  Potassium 3.5 - 5.1 mmol/L 4.0 3.9 3.5  Chloride 98 - 111 mmol/L 100 101 101  CO2 22 - 32 mmol/L 25 25 24   Calcium 8.9 - 10.3 mg/dL 8.7(L) 8.7(L) 7.4(L)  Total Protein 6.5 - 8.1 g/dL 6.5 6.4(L) -  Total Bilirubin 0.3 - 1.2 mg/dL 0.4 0.8 -  Alkaline Phos  38 - 126 U/L 69 90 -  AST 15 - 41 U/L 30 23 -  ALT 0 - 44 U/L 45(H) 29 -   Lab Results  Component Value Date   VD25OH 14.63 (L) 04/25/2020    DIAGNOSTIC IMAGING:  I have independently reviewed the scans and discussed with the patient. No results found.   ASSESSMENT:  1.  Left upper lobe lung nodule: -Patient felt pain in the left chest wall, went to the ER on 11/10/2018, chest x-ray showed abnormal findings. -PET scan on 11/23/2018 showed left upper lobe lung nodule measuring 1.7 cm with SUV of 4.2.  Single focus of metabolic activity within the mid vertebral body at T12 level.  SUV 5.6. -Quit smoking 9 years ago.  Smoked 1 to 2 packs a day for 20 years. -MRI of the T-spine with contrast on 12/14/2018 did not show any evidence of metastatic disease.  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 -MRI of the brain on 12/14/2018 did not show any evidence of metastatic disease. -She underwent a wedge resection of the left upper lobe lung nodule by Dr. Kipp Brood.  This showed benign lung parenchyma with dense inflammation including microabscess.  No evidence of malignancy. -The pathology was discussed in detail with the patient. -Chest x-ray done on 04/04/2019 showed currently no pleural effusions evident.  Mild scarring left midlung.  No edema or consolidation.  No adenopathy. -No new shortness of breath or cough.  No new event since last visit. -Patient will follow-up in 6 months with labs.  2.  Health maintenance: -Mammogram on 09/14/2018 was BI-RADS Category 1 -Colonoscopy on 03/21/2018 showed 4 to 6 mm polyps, 7 in number in the sigmoid colon, in the descending colon at the splenic flexure, in the transverse colon and at the hepatic flexure.  Diverticulosis in the sigmoid colon.  Pathology  consistent with tubular adenoma, sessile serrated polyp without dysplasia, hyperplastic polyp.   PLAN:  1.  Left upper lobe lung nodule: -She does not have any new onset cough or  chest pains. -She has mild pain in the left chest wall at the site of resection. -I have recommended repeat CT of the chest in 3 months given her smoking history.  We will continue yearly CT scans.  2.  Vitamin D deficiency: -She started taking vitamin D 5000 units daily.  Will check repeat level in 3 months.  If no improvement will give prescription vitamin D.  3.  Elevated ALT: -Likely from fatty infiltration of the liver.  We will closely monitor.   Orders placed this encounter:  No orders of the defined types were placed in this encounter.    Derek Jack, MD [place] [phone]   I, Milinda Antis, am acting as a scribe for Dr. Sanda Linger.  I, Derek Jack MD, have reviewed the above documentation for accuracy and completeness, and I agree with the above.

## 2020-05-03 ENCOUNTER — Other Ambulatory Visit (HOSPITAL_COMMUNITY): Payer: Self-pay

## 2020-05-03 DIAGNOSIS — R911 Solitary pulmonary nodule: Secondary | ICD-10-CM

## 2020-06-06 ENCOUNTER — Other Ambulatory Visit: Payer: Self-pay | Admitting: Family Medicine

## 2020-06-06 DIAGNOSIS — Z1231 Encounter for screening mammogram for malignant neoplasm of breast: Secondary | ICD-10-CM

## 2020-06-11 IMAGING — CR DG CHEST 2V
2 series · 2 of 2 positions shown · non-contrast
Comparison: November 10, 2018

CLINICAL DATA: Preop for left upper lobe node biopsy

EXAM:
CHEST - 2 VIEW

[w chest pa]
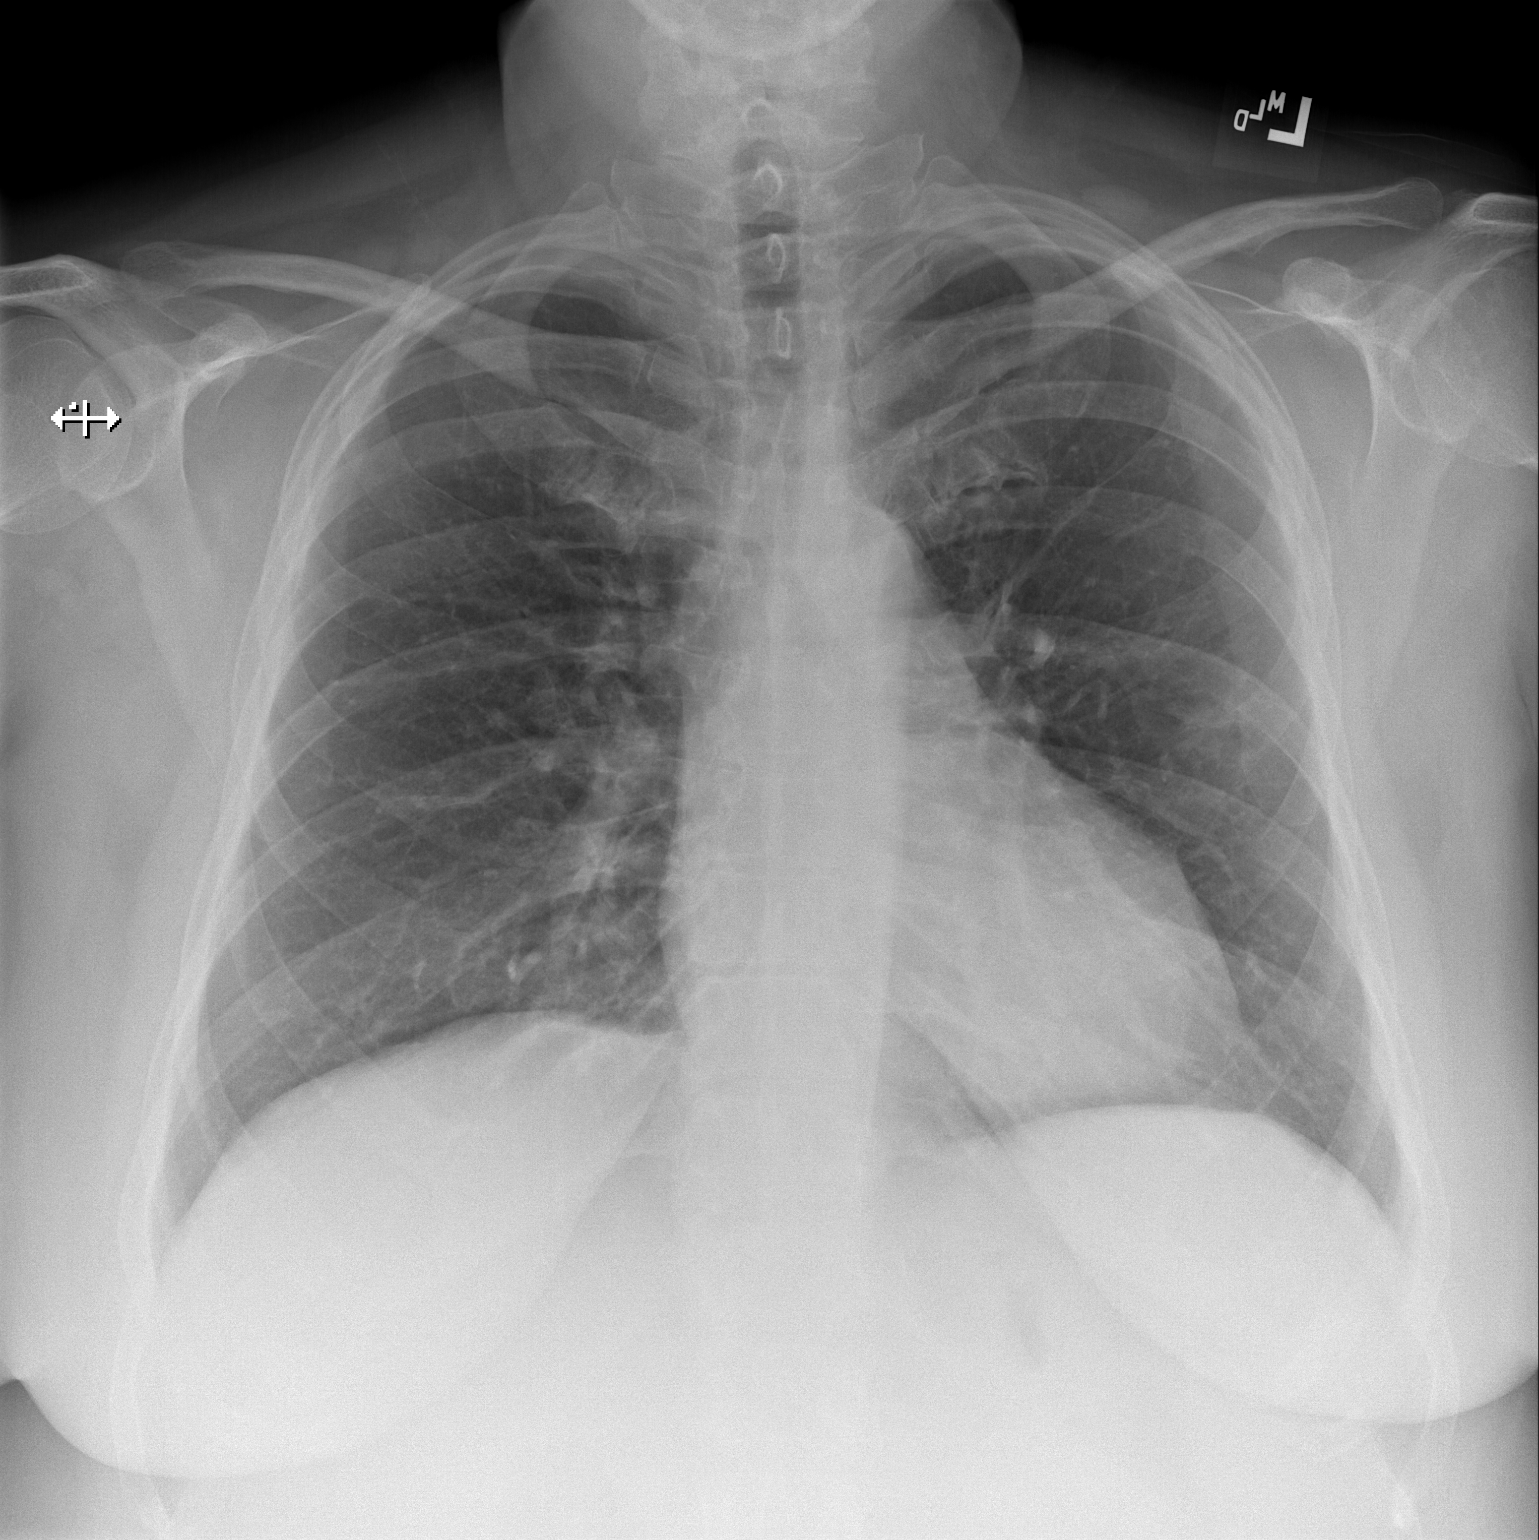

[w chest lat]
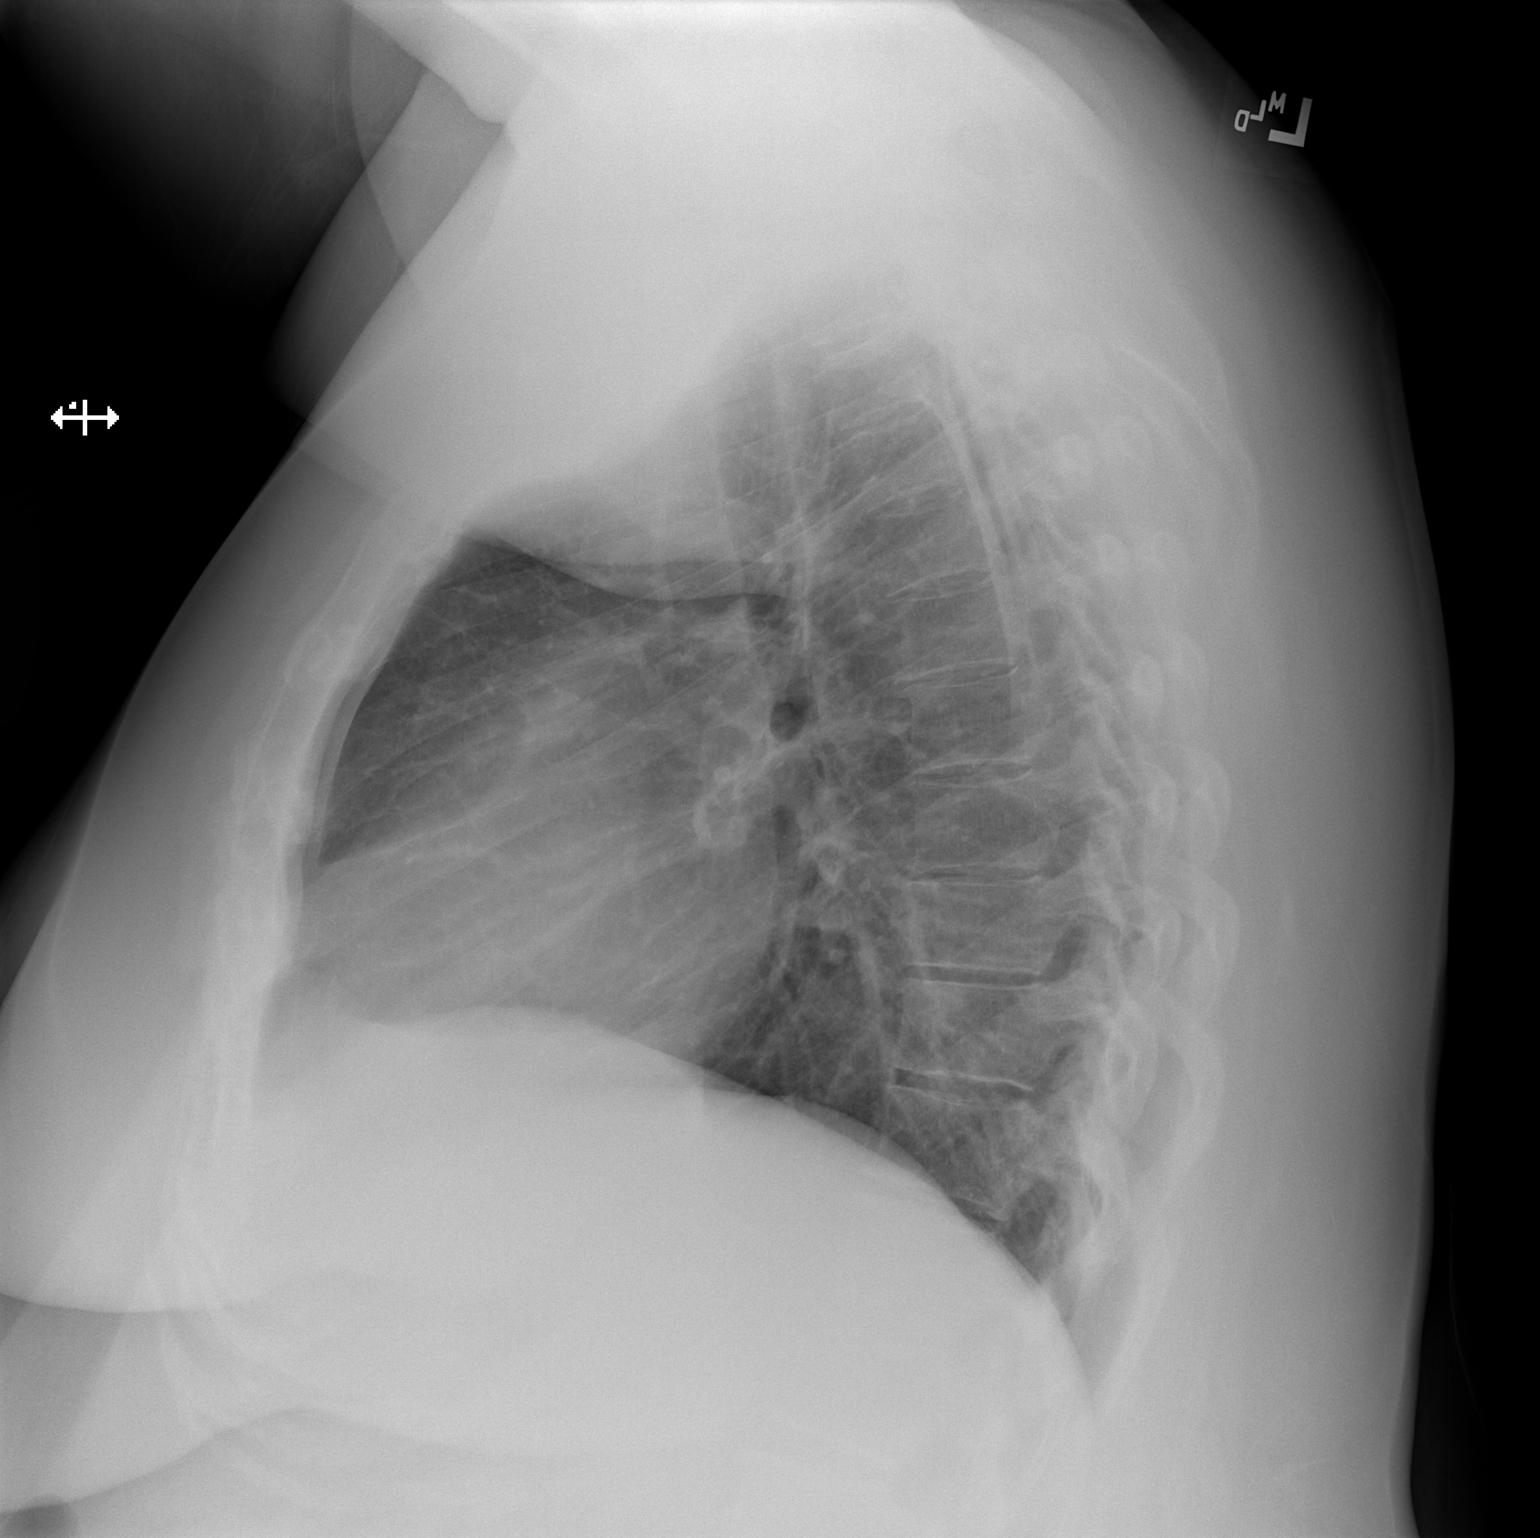

[2 of 2 positions shown; findings below may reference images not displayed]

FINDINGS: Redemonstrated 2 cm nodule in the lingula. No consolidation,
features of edema, pneumothorax, or effusion. Pulmonary vascularity
is normally distributed. The cardiomediastinal contours are
unremarkable. No acute osseous or soft tissue abnormality.
IMPRESSION: Similar appearance of a 2 cm nodule in the lingula. No acute
cardiopulmonary disease.

## 2020-06-15 IMAGING — DX DG CHEST 1V PORT
1 series · 1 of 1 positions shown · non-contrast
Comparison: 01/07/2019

CLINICAL DATA: Post thoracotomy.

EXAM:
PORTABLE CHEST 1 VIEW

[chest ap]
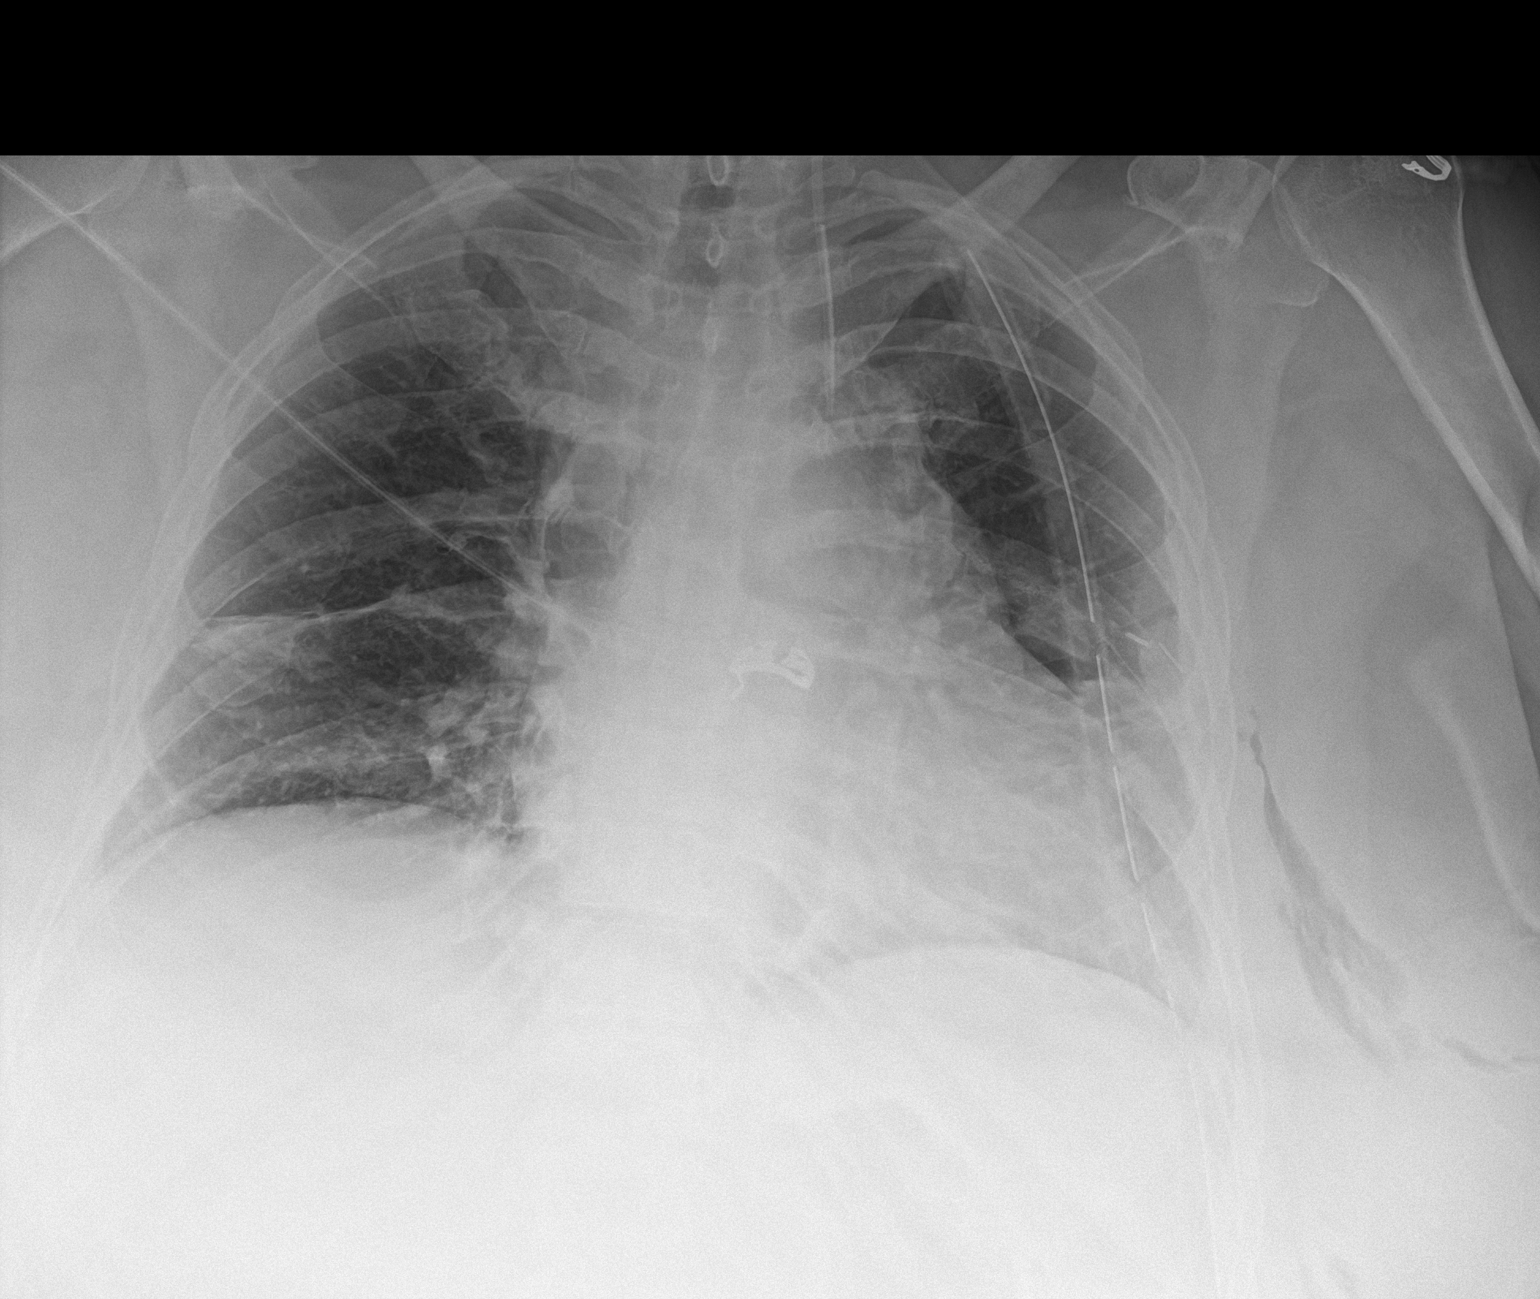

[1 of 1 positions shown; findings below may reference images not displayed]

FINDINGS: Left IJ central venous catheter has tip projecting over the superior
aspect of the aortic arch as this may be arterial. Left apical chest
tube in adequate position. Lungs are hypoinflated with mild
opacification over the right midlung and left mid to lower lung
likely atelectasis. Surgical clips over the lateral left midlung. No
definite pneumothorax visualized. No effusion. Cardiac silhouette
within normal. Mild prominence of the main pulmonary artery segment.
Remainder of the exam is unchanged.
IMPRESSION: 1. Postsurgical change over the lateral left midlung. Left apical
chest tube in place. No pneumothorax. Mild opacification over the
left mid to lower lung and right midlung likely atelectasis.

2. Left IJ catheter with tip over the medial aspect of the aortic
arch as this may be arterial in nature. Recommend clinical
correlation.

These results were called by telephone at the time of interpretation
on 01/11/2019 at [DATE] to patient's nurse, Gurmail Hinman, who
verbally acknowledged these results.

## 2020-06-16 IMAGING — DX DG CHEST 1V PORT
1 series · 1 of 1 positions shown · non-contrast
Comparison: Single-view of the chest 01/11/2019.

CLINICAL DATA: Patient status post left thoracotomy for a pulmonary
nodule 01/11/2019.

EXAM:
PORTABLE CHEST 1 VIEW

[chest ap]
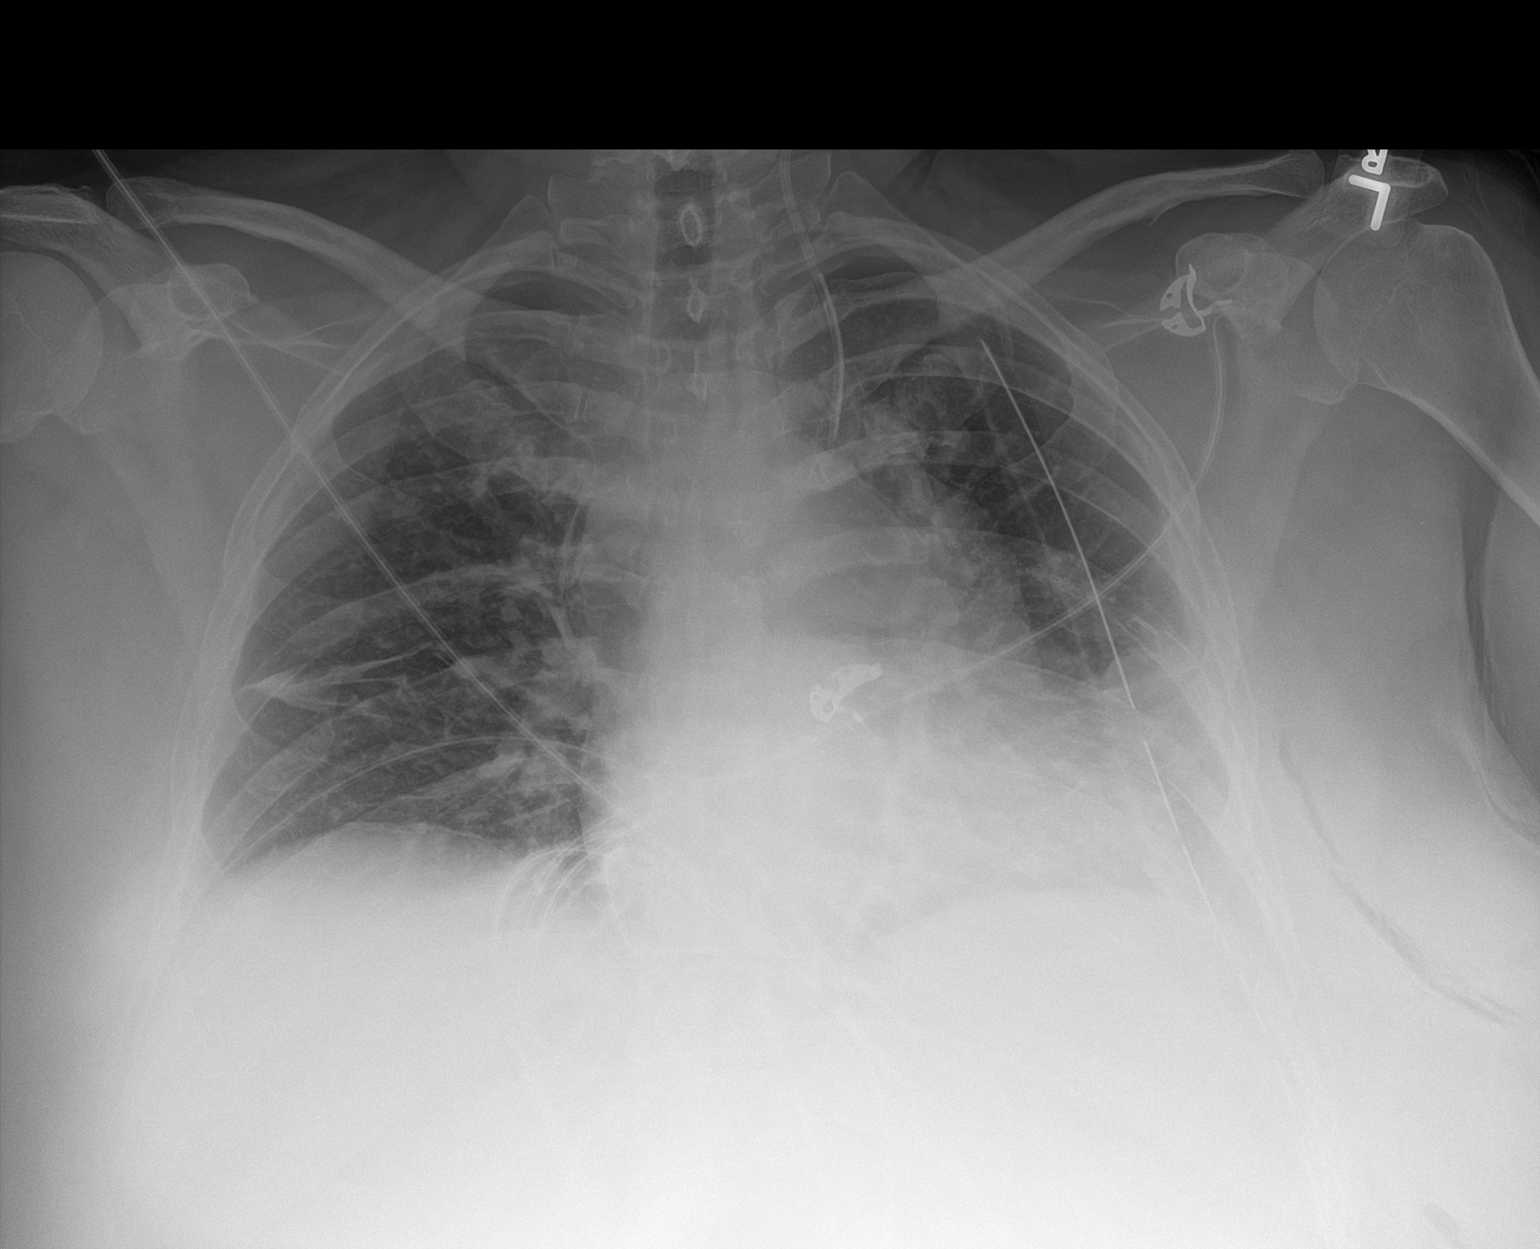

[1 of 1 positions shown; findings below may reference images not displayed]

FINDINGS: Left IJ catheter projecting at the superior margin of the aortic
arch is unchanged. Left chest tube has backed out slightly.
Subcutaneous emphysema in the left chest wall is decreased. Left
basilar atelectasis and a small focus of atelectasis in the right
mid lung again seen. There is likely a small left pleural effusion.
IMPRESSION: Left IJ catheter tip continues to project at the aortic arch and
could be arterial.

Left chest tube has backed out slightly.  No pneumothorax.

No change in left greater than right atelectasis and likely small
left pleural effusion.

## 2020-08-02 ENCOUNTER — Ambulatory Visit (HOSPITAL_COMMUNITY)
Admission: RE | Admit: 2020-08-02 | Discharge: 2020-08-02 | Disposition: A | Discharge status: Home / Self Care | Payer: PRIVATE HEALTH INSURANCE | Source: Ambulatory Visit | Attending: Hematology | Admitting: Hematology

## 2020-08-02 ENCOUNTER — Inpatient Hospital Stay (HOSPITAL_COMMUNITY): Payer: PRIVATE HEALTH INSURANCE

## 2020-08-02 ENCOUNTER — Other Ambulatory Visit: Payer: Self-pay

## 2020-08-02 DIAGNOSIS — R911 Solitary pulmonary nodule: Secondary | ICD-10-CM

## 2020-08-02 DIAGNOSIS — R918 Other nonspecific abnormal finding of lung field: Secondary | ICD-10-CM | POA: Insufficient documentation

## 2020-08-02 DIAGNOSIS — E559 Vitamin D deficiency, unspecified: Secondary | ICD-10-CM | POA: Insufficient documentation

## 2020-08-02 LAB — CBC WITH DIFFERENTIAL/PLATELET
Abs Immature Granulocytes: 0.03 10*3/uL (ref 0.00–0.07)
Basophils Absolute: 0.1 10*3/uL (ref 0.0–0.1)
Basophils Relative: 1 %
Eosinophils Absolute: 0.2 10*3/uL (ref 0.0–0.5)
Eosinophils Relative: 3 %
HCT: 38.1 % (ref 36.0–46.0)
Hemoglobin: 12.2 g/dL (ref 12.0–15.0)
Immature Granulocytes: 1 %
Lymphocytes Relative: 22 %
Lymphs Abs: 1.2 10*3/uL (ref 0.7–4.0)
MCH: 27.7 pg (ref 26.0–34.0)
MCHC: 32 g/dL (ref 30.0–36.0)
MCV: 86.4 fL (ref 80.0–100.0)
Monocytes Absolute: 0.5 10*3/uL (ref 0.1–1.0)
Monocytes Relative: 9 %
Neutro Abs: 3.3 10*3/uL (ref 1.7–7.7)
Neutrophils Relative %: 64 %
Platelets: 241 10*3/uL (ref 150–400)
RBC: 4.41 MIL/uL (ref 3.87–5.11)
RDW: 14.4 % (ref 11.5–15.5)
WBC: 5.2 10*3/uL (ref 4.0–10.5)
nRBC: 0 % (ref 0.0–0.2)

## 2020-08-02 LAB — VITAMIN D 25 HYDROXY (VIT D DEFICIENCY, FRACTURES): Vit D, 25-Hydroxy: 37.3 ng/mL (ref 30–100)

## 2020-08-02 LAB — COMPREHENSIVE METABOLIC PANEL
ALT: 41 U/L (ref 0–44)
AST: 30 U/L (ref 15–41)
Albumin: 3.8 g/dL (ref 3.5–5.0)
Alkaline Phosphatase: 70 U/L (ref 38–126)
Anion gap: 9 (ref 5–15)
BUN: 22 mg/dL — ABNORMAL HIGH (ref 6–20)
CO2: 28 mmol/L (ref 22–32)
Calcium: 9 mg/dL (ref 8.9–10.3)
Chloride: 100 mmol/L (ref 98–111)
Creatinine, Ser: 1.11 mg/dL — ABNORMAL HIGH (ref 0.44–1.00)
GFR, Estimated: 59 mL/min — ABNORMAL LOW (ref >60)
Glucose, Bld: 186 mg/dL — ABNORMAL HIGH (ref 70–99)
Potassium: 4 mmol/L (ref 3.5–5.1)
Sodium: 137 mmol/L (ref 135–145)
Total Bilirubin: 0.9 mg/dL (ref 0.3–1.2)
Total Protein: 6.5 g/dL (ref 6.5–8.1)

## 2020-08-09 ENCOUNTER — Inpatient Hospital Stay (HOSPITAL_COMMUNITY): Payer: PRIVATE HEALTH INSURANCE | Attending: Hematology | Admitting: Hematology

## 2020-08-09 ENCOUNTER — Other Ambulatory Visit: Payer: Self-pay

## 2020-08-09 VITALS — BP 135/60 | HR 90 | Temp 97.2°F | Resp 20 | Wt 298.1 lb

## 2020-08-09 DIAGNOSIS — Z87891 Personal history of nicotine dependence: Secondary | ICD-10-CM | POA: Diagnosis not present

## 2020-08-09 DIAGNOSIS — R918 Other nonspecific abnormal finding of lung field: Secondary | ICD-10-CM | POA: Insufficient documentation

## 2020-08-09 DIAGNOSIS — Z803 Family history of malignant neoplasm of breast: Secondary | ICD-10-CM | POA: Insufficient documentation

## 2020-08-09 DIAGNOSIS — E559 Vitamin D deficiency, unspecified: Secondary | ICD-10-CM | POA: Insufficient documentation

## 2020-08-09 DIAGNOSIS — R911 Solitary pulmonary nodule: Secondary | ICD-10-CM | POA: Diagnosis not present

## 2020-08-09 DIAGNOSIS — R7401 Elevation of levels of liver transaminase levels: Secondary | ICD-10-CM | POA: Insufficient documentation

## 2020-08-09 NOTE — Progress Notes (Signed)
Tanya Galloway, Tanya Galloway [id number]   CLINIC:  Medical Oncology/Hematology  PCP:  Caryl Bis, MD [address] [place] [address] [phone]   REASON FOR VISIT:  Follow-up for left lung nodule  PRIOR THERAPY: VATS with left upper lobe resection on 01/11/2019  NGS Results: Not done  CURRENT THERAPY: Surveillance  BRIEF ONCOLOGIC HISTORY:  Oncology History   No history exists.    CANCER STAGING: Cancer Staging No matching staging information was found for the patient.  INTERVAL HISTORY:  Tanya Galloway, a 55 y.o. female, returns for routine follow-up of her left lung nodule. Tanya Galloway was last seen on 05/02/2020.   Today she reports feeling okay. She is taking vitamin D 2,000 units BID. She has been losing weight though [employer], has already lost 5 lbs in 1 month, and is trying to control her DM. She denies having any SOB or new cough.   REVIEW OF SYSTEMS:  Review of Systems  Constitutional: Positive for fatigue (depleted). Negative for appetite change and unexpected weight change.  Respiratory: Negative for cough and shortness of breath.   All other systems reviewed and are negative.   PAST MEDICAL/SURGICAL HISTORY:  Past Medical History:  Diagnosis Date  . Diabetes mellitus without complication ([place])   . Diverticulosis   . Fatty liver   . GERD (gastroesophageal reflux disease)   . H/O seasonal allergies   . Hemorrhoids   . Hypertension   . Irritable bowel syndrome    Past Surgical History:  Procedure Laterality Date  . COLONOSCOPY N/A 03/11/2018   Procedure: COLONOSCOPY;  Surgeon: Rogene Houston, MD;  [facility];  Service: Endoscopy;  Laterality: N/A;  930  . KNEE ARTHROSCOPY  2011   left knee  . LOBECTOMY Left 01/11/2019   Procedure: possible LOBECTOMY;  Surgeon: Lajuana Matte, MD;  Location: [place];  Service: Thoracic;  Laterality: Left;  . POLYPECTOMY  03/11/2018   Procedure: POLYPECTOMY;   Surgeon: Rogene Houston, MD;  [facility];  Service: Endoscopy;;  splenic flexure (CSx1), transverse colon (CBx1), hepatic flexure (CSx1), descending colon (CSx1)distal sigmoid colon(CSx3)  . VIDEO ASSISTED THORACOSCOPY (VATS)/WEDGE RESECTION Left 01/11/2019   Procedure: VIDEO ASSISTED THORACOSCOPY (VATS)/LEFT UPPER LOBE WEDGE RESECTION;  Surgeon: Lajuana Matte, MD;  Location: [facility];  Service: Thoracic;  Laterality: Left;  Marland Kitchen VIDEO BRONCHOSCOPY N/A 01/11/2019   Procedure: VIDEO BRONCHOSCOPY;  Surgeon: Lajuana Matte, MD;  [facility];  Service: Thoracic;  Laterality: N/A;    SOCIAL HISTORY:  Social History   Socioeconomic History  . Marital status: Married    Spouse name: Reatha Sur.  . Number of children: Not on file  . Years of education: Not on file  . Highest education level: Not on file  Occupational History  . Not on file  Tobacco Use  . Smoking status: Former Smoker    Types: Cigarettes    Quit date: 09/15/2009    Years since quitting: 10.9  . Smokeless tobacco: Never Used  . Tobacco comment: Patient smoked for 25 years, 1 1/2 ppd  Vaping Use  . Vaping Use: Never used  Substance and Sexual Activity  . Alcohol use: Yes    Comment: Twice a Year  . Drug use: No  . Sexual activity: Not on file  Other Topics Concern  . Not on file  Social History Narrative  . Not on file   Social Determinants  of Health   Financial Resource Strain: Not on file  Food Insecurity: Not on file  Transportation Needs: Not on file  Physical Activity: Not on file  Stress: Not on file  Social Connections: Not on file  Intimate Partner Violence: Not on file    FAMILY HISTORY:  Family History  Problem Relation Age of Onset  . Diabetes Mother   . Thyroid disease Mother   . Hypertension Mother   . Hypertension Father   . Hypertension Brother   . Healthy Brother   . Breast cancer Maternal Grandmother   . Colon cancer Neg Hx     CURRENT MEDICATIONS:   Current Outpatient Medications  Medication Sig Dispense Refill  . acetaminophen (TYLENOL) 500 MG tablet Take 2 tablets (1,000 mg total) by mouth every 6 (six) hours. 30 tablet 0  . calcipotriene-betamethasone (TACLONEX SCALP) external suspension Apply 6 drops topically daily.    . Cholecalciferol (VITAMIN D3) 50 MCG (2000 UT) TABS Take 1 tablet by mouth daily.    . clobetasol (TEMOVATE) 0.05 % external solution Apply 1 application topically 2 (two) times daily as needed.    . Continuous Blood Gluc Sensor (FREESTYLE LIBRE 14 DAY SENSOR) MISC SMARTSIG:1 Topical Every 2 Weeks    . dicyclomine (BENTYL) 10 MG capsule Take 1 capsule (10 mg total) by mouth 2 (two) times daily before a meal. 180 capsule 3  . esomeprazole (NEXIUM) 40 MG capsule Take 40 mg by mouth daily.    Marland Kitchen guaiFENesin (MUCINEX) 600 MG 12 hr tablet Take 600 mg by mouth 2 (two) times daily as needed.    Marland Kitchen ketoconazole (NIZORAL) 2 % shampoo Apply 1 application topically 2 (two) times a week.    . Lancets (ONETOUCH DELICA PLUS [id number]) MISC USE TO TEST FASTING BLOOD SUGAR D    . lisinopril-hydrochlorothiazide (ZESTORETIC) 20-12.5 MG tablet Take 1 tablet by mouth daily.    . metFORMIN (GLUCOPHAGE-XR) 500 MG 24 hr tablet Take 500-1,000 mg by mouth See admin instructions. Take 1 tablet (500 mg) in the morning & 2 tablets (1000 mg) by mouth in the evening.  2  . ondansetron (ZOFRAN-ODT) 4 MG disintegrating tablet Take 4 mg by mouth.    [name] VERIO test strip 1 each by Other route daily.     . traMADol (ULTRAM) 50 MG tablet Take 50 mg by mouth every 6 (six) hours as needed.     No current facility-administered medications for this visit.    ALLERGIES:  Allergies  Allergen Reactions  . Sulfa Antibiotics Nausea Only and Swelling    SWELLING REACTION UNSPECIFIED     PHYSICAL EXAM:  Performance status (ECOG): 1 - Symptomatic but completely ambulatory  Vitals:   08/09/20 1521  BP: 135/60  Pulse: 90  Resp: 20  Temp: (!) 97.2  F (36.2 C)  SpO2: 99%   Wt Readings from Last 3 Encounters:  08/09/20 298 lb 1.6 oz (135.2 kg)  05/02/20 (!) 310 lb 1.6 oz (140.7 kg)  04/06/19 (!) 304 lb 3.2 oz (138 kg)   Physical Exam Vitals reviewed.  Constitutional:      Appearance: Normal appearance. She is obese.  Cardiovascular:     Rate and Rhythm: Normal rate and regular rhythm.     Pulses: Normal pulses.     Heart sounds: Normal heart sounds.  Pulmonary:     Effort: Pulmonary effort is normal.     Breath sounds: Normal breath sounds.  Neurological:     General: No focal deficit present.  Mental Status: She is alert and oriented to person, place, and time.  Psychiatric:        Mood and Affect: Mood normal.        Behavior: Behavior normal.      LABORATORY DATA:  I have reviewed the labs as listed.  CBC Latest Ref Rng & Units 08/02/2020 04/25/2020 02/17/2019  WBC 4.0 - 10.5 K/uL 5.2 6.0 6.5  Hemoglobin 12.0 - 15.0 g/dL 12.2 13.0 13.6  Hematocrit 36.0 - 46.0 % 38.1 40.3 42.5  Platelets 150 - 400 K/uL 241 237 282   CMP Latest Ref Rng & Units 08/02/2020 04/25/2020 02/17/2019  Glucose 70 - 99 mg/dL 186(H) 184(H) 184(H)  BUN 6 - 20 mg/dL 22(H) 18 15  Creatinine 0.44 - 1.00 mg/dL 1.11(H) 0.91 0.87  Sodium 135 - 145 mmol/L 137 135 136  Potassium 3.5 - 5.1 mmol/L 4.0 4.0 3.9  Chloride 98 - 111 mmol/L 100 100 101  CO2 22 - 32 mmol/L 28 25 25   Calcium 8.9 - 10.3 mg/dL 9.0 8.7(L) 8.7(L)  Total Protein 6.5 - 8.1 g/dL 6.5 6.5 6.4(L)  Total Bilirubin 0.3 - 1.2 mg/dL 0.9 0.4 0.8  Alkaline Phos 38 - 126 U/L 70 69 90  AST 15 - 41 U/L 30 30 23   ALT 0 - 44 U/L 41 45(H) 29   Lab Results  Component Value Date   VD25OH 37.30 08/02/2020   VD25OH 14.63 (L) 04/25/2020    DIAGNOSTIC IMAGING:  I have independently reviewed the scans and discussed with the patient. CT Chest Wo Contrast  Result Date: 08/02/2020 CLINICAL DATA:  Follow-up lung nodule. EXAM: CT CHEST WITHOUT CONTRAST TECHNIQUE: Multidetector CT imaging of the  chest was performed following the standard protocol without IV contrast. COMPARISON:  CT 02/17/2019 FINDINGS: Cardiovascular: No significant vascular findings. Normal heart size. No pericardial effusion. Mediastinum/Nodes: No axillary or supraclavicular adenopathy. No mediastinal or hilar adenopathy. No pericardial fluid. Esophagus normal. Lungs/Pleura: Postsurgical change in the LEFT upper lobe with linear scarring. No suspicious nodularity. Small nodule in the RIGHT middle lobe measures 2 mm (image 75/series 4). Nodule located on comparison exam. Upper Abdomen: Limited view of the liver, kidneys, pancreas are unremarkable. Normal adrenal glands. Musculoskeletal: No aggressive osseous lesion. IMPRESSION: 1. Status post resection of LEFT upper lobe nodule. No evidence of local recurrence. 2. Stable small nodule in the RIGHT middle lobe. 3. No new or suspicious pulmonary nodules. 4. No mediastinal lymphadenopathy. Electronically Signed   By: Suzy Bouchard M.D.   On: 08/02/2020 15:07     ASSESSMENT:  1. Left upper lobe lung nodule: -Patient felt pain in the left chest wall, went to the ER on 11/10/2018, chest x-ray showed abnormal findings. -PET scan on 11/23/2018 showed left upper lobe lung nodule measuring 1.7 cm with SUV of 4.2. Single focus of metabolic activity within the mid vertebral body at T12 level. SUV 5.6. -Quit smoking 9 years ago. Smoked 1 to 2 packs a day for 20 years. -MRI of the T-spine with contrast on 12/14/2018 did not show any evidence of metastatic disease. 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 -MRI of the brain on 12/14/2018 did not show any evidence of metastatic disease. -She underwent a wedge resection of the left upper lobe lung nodule by Dr. Kipp Brood. This showed benign lung parenchyma with dense inflammation including microabscess. No evidence of malignancy. -The pathology was discussed in detail with the patient. -Chest x-ray done on  04/04/2019 showed currently no pleural  effusions evident. Mild scarring left midlung. No edema or consolidation. No adenopathy. -No new shortness of breath or cough. No new event since last visit. -Patient will follow-up in 6 monthswith labs.  2. Health maintenance: -Mammogram on 09/14/2018 was BI-RADS Category 1 -Colonoscopy on 03/21/2018 showed 4 to 6 mm polyps, 7 in number in the sigmoid colon, in the descending colon at the splenic flexure, in the transverse colon and at the hepatic flexure. Diverticulosis in the sigmoid colon. Pathology consistent with tubular adenoma, sessile serrated polyp without dysplasia, hyperplastic polyp.   PLAN:  1. Left upper lobe lung nodule: -She does not report any new onset cough or chest pains. - We reviewed CT chest without contrast from 07/25/2020 which showed no evidence of local recurrence in the left lung.  Stable small right middle lobe nodule measuring 2 mm.  No mediastinal adenopathy.  No new or suspicious nodules. - I have recommended follow-up in 1 year with repeat CT scan.  2.  Vitamin D deficiency: -She is taking vitamin D 2000 units twice daily. - Vitamin D level improved to 37.  Continue the same dose.  3.  Elevated ALT: -Likely from fatty infiltration of liver. - She lost 5 pounds since she joined weight watchers for the last 1 month.  Latest ALT has normalized.   Orders placed this encounter:  No orders of the defined types were placed in this encounter.    Derek Jack, MD [facility] [phone]   I, Milinda Antis, am acting as a scribe for Dr. Sanda Linger.  I, Derek Jack MD, have reviewed the above documentation for accuracy and completeness, and I agree with the above.

## 2020-08-09 NOTE — Patient Instructions (Addendum)
[place] at [facility] Discharge Instructions  You were seen today by Dr. Delton Coombes. He went over your recent results and scans. You will continue getting yearly CT scans; you will be scheduled to have a CT scan of chest done before your next visit. Dr. Delton Coombes will see you back in 1 year for labs and follow up.   Thank you for choosing [place] at [facility] to provide your oncology and hematology care.  To afford each patient quality time with our provider, please arrive at least 15 minutes before your scheduled appointment time.   If you have a lab appointment with the [facility] please come in thru the Main Entrance and check in at the main information desk  You need to re-schedule your appointment should you arrive 10 or more minutes late.  We strive to give you quality time with our providers, and arriving late affects you and other patients whose appointments are after yours.  Also, if you no show three or more times for appointments you may be dismissed from the clinic at the providers discretion.     Again, thank you for choosing [facility].  Our hope is that these requests will decrease the amount of time that you wait before being seen by our physicians.       _____________________________________________________________  Should you have questions after your visit to [facility], please contact our office at [phone] between the hours of 8:00 a.m. and 4:30 p.m.  Voicemails left after 4:00 p.m. will not be returned until the following business day.  For prescription refill requests, have your pharmacy contact our office and allow 72 hours.    Cancer Center Support Programs:   > Cancer Support Group  2nd Tuesday of the month 1pm-2pm, Journey Room

## 2020-10-09 ENCOUNTER — Ambulatory Visit (INDEPENDENT_AMBULATORY_CARE_PROVIDER_SITE_OTHER): Payer: PRIVATE HEALTH INSURANCE | Admitting: Internal Medicine

## 2020-10-30 ENCOUNTER — Ambulatory Visit
Admission: RE | Admit: 2020-10-30 | Discharge: 2020-10-30 | Disposition: A | Discharge status: Home / Self Care | Payer: No Typology Code available for payment source | Source: Ambulatory Visit | Attending: Family Medicine | Admitting: Family Medicine

## 2020-10-30 ENCOUNTER — Other Ambulatory Visit: Payer: Self-pay

## 2020-10-30 DIAGNOSIS — Z1231 Encounter for screening mammogram for malignant neoplasm of breast: Secondary | ICD-10-CM

## 2021-01-21 ENCOUNTER — Other Ambulatory Visit (HOSPITAL_COMMUNITY): Payer: Self-pay

## 2021-01-21 MED ORDER — INFLUENZA VAC SPLIT QUAD 0.5 ML IM SUSY
PREFILLED_SYRINGE | INTRAMUSCULAR | 0 refills | Status: DC
  Filled 2021-01-21: qty 0.5, 1d supply, fill #0

## 2021-08-09 ENCOUNTER — Ambulatory Visit (HOSPITAL_COMMUNITY)
Admission: RE | Admit: 2021-08-09 | Discharge: 2021-08-09 | Disposition: A | Discharge status: Home / Self Care | Payer: PRIVATE HEALTH INSURANCE | Source: Ambulatory Visit | Attending: Hematology | Admitting: Hematology

## 2021-08-09 ENCOUNTER — Inpatient Hospital Stay (HOSPITAL_COMMUNITY): Payer: PRIVATE HEALTH INSURANCE | Attending: Hematology

## 2021-08-09 DIAGNOSIS — R911 Solitary pulmonary nodule: Secondary | ICD-10-CM | POA: Insufficient documentation

## 2021-08-09 DIAGNOSIS — E119 Type 2 diabetes mellitus without complications: Secondary | ICD-10-CM | POA: Insufficient documentation

## 2021-08-09 DIAGNOSIS — R42 Dizziness and giddiness: Secondary | ICD-10-CM | POA: Insufficient documentation

## 2021-08-09 DIAGNOSIS — R519 Headache, unspecified: Secondary | ICD-10-CM | POA: Insufficient documentation

## 2021-08-09 DIAGNOSIS — Z87891 Personal history of nicotine dependence: Secondary | ICD-10-CM | POA: Insufficient documentation

## 2021-08-09 DIAGNOSIS — E559 Vitamin D deficiency, unspecified: Secondary | ICD-10-CM | POA: Insufficient documentation

## 2021-08-09 DIAGNOSIS — Z79899 Other long term (current) drug therapy: Secondary | ICD-10-CM | POA: Insufficient documentation

## 2021-08-09 LAB — CBC WITH DIFFERENTIAL/PLATELET
Abs Immature Granulocytes: 0.03 10*3/uL (ref 0.00–0.07)
Basophils Absolute: 0.1 10*3/uL (ref 0.0–0.1)
Basophils Relative: 1 %
Eosinophils Absolute: 0.2 10*3/uL (ref 0.0–0.5)
Eosinophils Relative: 3 %
HCT: 37 % (ref 36.0–46.0)
Hemoglobin: 11.7 g/dL — ABNORMAL LOW (ref 12.0–15.0)
Immature Granulocytes: 1 %
Lymphocytes Relative: 23 %
Lymphs Abs: 1.3 10*3/uL (ref 0.7–4.0)
MCH: 26.5 pg (ref 26.0–34.0)
MCHC: 31.6 g/dL (ref 30.0–36.0)
MCV: 83.9 fL (ref 80.0–100.0)
Monocytes Absolute: 0.5 10*3/uL (ref 0.1–1.0)
Monocytes Relative: 9 %
Neutro Abs: 3.5 10*3/uL (ref 1.7–7.7)
Neutrophils Relative %: 63 %
Platelets: 221 10*3/uL (ref 150–400)
RBC: 4.41 MIL/uL (ref 3.87–5.11)
RDW: 14.6 % (ref 11.5–15.5)
WBC: 5.6 10*3/uL (ref 4.0–10.5)
nRBC: 0 % (ref 0.0–0.2)

## 2021-08-09 LAB — COMPREHENSIVE METABOLIC PANEL
ALT: 34 U/L (ref 0–44)
AST: 21 U/L (ref 15–41)
Albumin: 3.8 g/dL (ref 3.5–5.0)
Alkaline Phosphatase: 73 U/L (ref 38–126)
Anion gap: 7 (ref 5–15)
BUN: 19 mg/dL (ref 6–20)
CO2: 27 mmol/L (ref 22–32)
Calcium: 8.9 mg/dL (ref 8.9–10.3)
Chloride: 107 mmol/L (ref 98–111)
Creatinine, Ser: 1 mg/dL (ref 0.44–1.00)
GFR, Estimated: >60 mL/min (ref >60)
Glucose, Bld: 104 mg/dL — ABNORMAL HIGH (ref 70–99)
Potassium: 4.1 mmol/L (ref 3.5–5.1)
Sodium: 141 mmol/L (ref 135–145)
Total Bilirubin: 0.4 mg/dL (ref 0.3–1.2)
Total Protein: 6.5 g/dL (ref 6.5–8.1)

## 2021-08-09 LAB — VITAMIN D 25 HYDROXY (VIT D DEFICIENCY, FRACTURES): Vit D, 25-Hydroxy: 44 ng/mL (ref 30–100)

## 2021-08-15 ENCOUNTER — Ambulatory Visit (HOSPITAL_COMMUNITY): Payer: PRIVATE HEALTH INSURANCE | Admitting: Hematology

## 2021-08-19 ENCOUNTER — Inpatient Hospital Stay (HOSPITAL_BASED_OUTPATIENT_CLINIC_OR_DEPARTMENT_OTHER): Payer: PRIVATE HEALTH INSURANCE | Admitting: Hematology

## 2021-08-19 ENCOUNTER — Ambulatory Visit (HOSPITAL_COMMUNITY): Payer: No Typology Code available for payment source | Admitting: Hematology

## 2021-08-19 VITALS — BP 138/83 | HR 84 | Temp 97.6°F | Resp 16 | Wt 288.4 lb

## 2021-08-19 DIAGNOSIS — Z79899 Other long term (current) drug therapy: Secondary | ICD-10-CM | POA: Diagnosis not present

## 2021-08-19 DIAGNOSIS — R911 Solitary pulmonary nodule: Secondary | ICD-10-CM

## 2021-08-19 DIAGNOSIS — E119 Type 2 diabetes mellitus without complications: Secondary | ICD-10-CM | POA: Diagnosis not present

## 2021-08-19 DIAGNOSIS — E559 Vitamin D deficiency, unspecified: Secondary | ICD-10-CM

## 2021-08-19 DIAGNOSIS — R42 Dizziness and giddiness: Secondary | ICD-10-CM | POA: Diagnosis not present

## 2021-08-19 DIAGNOSIS — R519 Headache, unspecified: Secondary | ICD-10-CM | POA: Diagnosis not present

## 2021-08-19 DIAGNOSIS — Z87891 Personal history of nicotine dependence: Secondary | ICD-10-CM | POA: Diagnosis not present

## 2021-08-19 NOTE — Patient Instructions (Addendum)
[place] at [facility] ?Discharge Instructions ? ? ?You were seen and examined today by Dr. Delton Coombes. ? ?He reviewed the results of your lab work and CT scan, which was normal/stable.  ? ?Return as scheduled in 1 year.  ? ? ?Thank you for choosing [place] at [facility] to provide your oncology and hematology care.  To afford each patient quality time with our provider, please arrive at least 15 minutes before your scheduled appointment time.  ? ?If you have a lab appointment with the [facility] please come in thru the Main Entrance and check in at the main information desk. ? ?You need to re-schedule your appointment should you arrive 10 or more minutes late.  We strive to give you quality time with our providers, and arriving late affects you and other patients whose appointments are after yours.  Also, if you no show three or more times for appointments you may be dismissed from the clinic at the providers discretion.     ?Again, thank you for choosing [facility].  Our hope is that these requests will decrease the amount of time that you wait before being seen by our physicians.       ?_____________________________________________________________ ? ?Should you have questions after your visit to [facility], please contact our office at [phone] and follow the prompts.  Our office hours are 8:00 a.m. and 4:30 p.m. Monday - Friday.  Please note that voicemails left after 4:00 p.m. may not be returned until the following business day.  We are closed weekends and major holidays.  You do have access to a nurse 24-7, just call the main number to the clinic [phone] and do not press any options, hold on the line and a nurse will answer the phone.   ? ?For prescription refill requests, have your pharmacy contact our office and allow 72 hours.   ? ?Due to Covid, you will need to wear a mask upon entering the hospital. If you do  not have a mask, a mask will be given to you at the Main Entrance upon arrival. For doctor visits, patients may have 1 support person age 6 or older with them. For treatment visits, patients can not have anyone with them due to social distancing guidelines and our immunocompromised population.  ? ?   ?

## 2021-08-19 NOTE — Progress Notes (Signed)
? ?[facility] ?[address] ?[place], [place] [address] ? ? ?CLINIC:  ?Medical Oncology/Hematology ? ?PCP:  ?Caryl Bis, MD ?[address] [place] [address] ?[phone] ? ? ?REASON FOR VISIT:  ?Follow-up for left upper lobe lung nodule ? ?PRIOR THERAPY: VATS with left upper lobe resection on 01/11/2019 ? ?NGS Results: not done ? ?CURRENT THERAPY: surveillance ? ?BRIEF ONCOLOGIC HISTORY:  ?Oncology History  ? No history exists.  ? ? ?CANCER STAGING: ?Cancer Staging  ?No matching staging information was found for the patient. ? ?INTERVAL HISTORY:  ?Ms. Tanya Galloway, a 56 y.o. female, returns for routine follow-up of her left upper lobe lung nodule. Irelynd was last seen on 08/09/2020.  ? ?Today she reports feeling good, and she is accompanied by her husband. She reports intermittent episodes of dizziness and headaches. She denies recent lung infections. She continues to take vitamin D.  ? ?REVIEW OF SYSTEMS:  ?Review of Systems  ?Constitutional:  Negative for appetite change and fatigue.  ?Neurological:  Positive for dizziness and headaches.  ?Psychiatric/Behavioral:  Positive for sleep disturbance.   ?All other systems reviewed and are negative. ? ?PAST MEDICAL/SURGICAL HISTORY:  ?Past Medical History:  ?Diagnosis Date  ? Diabetes mellitus without complication ([place])   ? Diverticulosis   ? Fatty liver   ? GERD (gastroesophageal reflux disease)   ? H/O seasonal allergies   ? Hemorrhoids   ? Hypertension   ? Irritable bowel syndrome   ? ?Past Surgical History:  ?Procedure Laterality Date  ? COLONOSCOPY N/A 03/11/2018  ? Procedure: COLONOSCOPY;  Surgeon: Rogene Houston, MD;  [facility];  Service: Endoscopy;  Laterality: N/A;  930  ? KNEE ARTHROSCOPY  2011  ? left knee  ? LOBECTOMY Left 01/11/2019  ? Procedure: possible LOBECTOMY;  Surgeon: Lajuana Matte, MD;  Location: [place];  Service: Thoracic;  Laterality: Left;  ? POLYPECTOMY  03/11/2018  ? Procedure: POLYPECTOMY;  Surgeon: Rogene Houston, MD;  [facility];  Service: Endoscopy;;  splenic flexure (CSx1), transverse colon (CBx1), hepatic flexure (CSx1), descending colon (CSx1)distal sigmoid colon(CSx3)  ? VIDEO ASSISTED THORACOSCOPY (VATS)/WEDGE RESECTION Left 01/11/2019  ? Procedure: VIDEO ASSISTED THORACOSCOPY (VATS)/LEFT UPPER LOBE WEDGE RESECTION;  Surgeon: Lajuana Matte, MD;  Location: [place];  Service: Thoracic;  Laterality: Left;  ? VIDEO BRONCHOSCOPY N/A 01/11/2019  ? Procedure: VIDEO BRONCHOSCOPY;  Surgeon: Lajuana Matte, MD;  Location: [place];  Service: Thoracic;  Laterality: N/A;  ? ? ?SOCIAL HISTORY:  ?Social History  ? ?Socioeconomic History  ? Marital status: Married  ?  Spouse name: Yula Crotwell.  ? Number of children: Not on file  ? Years of education: Not on file  ? Highest education level: Not on file  ?Occupational History  ? Not on file  ?Tobacco Use  ? Smoking status: Former  ?  Types: Cigarettes  ?  Quit date: 09/15/2009  ?  Years since quitting: 11.9  ? Smokeless tobacco: Never  ? Tobacco comments:  ?  Patient smoked for 25 years, 1 1/2 ppd  ?Vaping Use  ? Vaping Use: Never used  ?Substance and Sexual Activity  ? Alcohol use: Yes  ?  Comment: Twice a Year  ? Drug use: No  ? Sexual activity: Not on file  ?Other Topics Concern  ? Not on file  ?Social History Narrative  ? Not on file  ? ?Social Determinants of Health  ? ?Financial Resource Strain:  Not on file  ?Food Insecurity: Not on file  ?Transportation Needs: Not on file  ?Physical Activity: Not on file  ?Stress: Not on file  ?Social Connections: Not on file  ?Intimate Partner Violence: Not on file  ? ? ?FAMILY HISTORY:  ?Family History  ?Problem Relation Age of Onset  ? Diabetes Mother   ? Thyroid disease Mother   ? Hypertension Mother   ? Hypertension Father   ? Hypertension Brother   ? Healthy Brother   ? Breast cancer Maternal Grandmother   ? Colon cancer Neg Hx   ? ? ?CURRENT MEDICATIONS:  ?Current Outpatient Medications  ?Medication Sig  Dispense Refill  ? acetaminophen (TYLENOL) 500 MG tablet Take 2 tablets (1,000 mg total) by mouth every 6 (six) hours. 30 tablet 0  ? calcipotriene-betamethasone (TACLONEX SCALP) external suspension Apply 6 drops topically daily.    ? Cholecalciferol (VITAMIN D3) 50 MCG (2000 UT) TABS Take 1 tablet by mouth daily.    ? clobetasol (TEMOVATE) 0.05 % external solution Apply 1 application topically 2 (two) times daily as needed.    ? Continuous Blood Gluc Sensor (FREESTYLE LIBRE 14 DAY SENSOR) MISC SMARTSIG:1 Topical Every 2 Weeks    ? dicyclomine (BENTYL) 10 MG capsule Take 1 capsule (10 mg total) by mouth 2 (two) times daily before a meal. 180 capsule 3  ? esomeprazole (NEXIUM) 40 MG capsule Take 40 mg by mouth daily.    ? guaiFENesin (MUCINEX) 600 MG 12 hr tablet Take 600 mg by mouth 2 (two) times daily as needed.    ? influenza vac split quadrivalent PF (FLUARIX) 0.5 ML injection Inject into the muscle. 0.5 mL 0  ? ketoconazole (NIZORAL) 2 % shampoo Apply 1 application topically 2 (two) times a week.    ? Lancets (ONETOUCH DELICA PLUS [id number]) MISC USE TO TEST FASTING BLOOD SUGAR D    ? lisinopril-hydrochlorothiazide (ZESTORETIC) 20-12.5 MG tablet Take 1 tablet by mouth daily.    ? metFORMIN (GLUCOPHAGE-XR) 500 MG 24 hr tablet Take 500-1,000 mg by mouth See admin instructions. Take 1 tablet (500 mg) in the morning & 2 tablets (1000 mg) by mouth in the evening.  2  ? ondansetron (ZOFRAN-ODT) 4 MG disintegrating tablet Take 4 mg by mouth.    ? ONETOUCH VERIO test strip 1 each by Other route daily.     ? traMADol (ULTRAM) 50 MG tablet Take 50 mg by mouth every 6 (six) hours as needed.    ? ?No current facility-administered medications for this visit.  ? ? ?ALLERGIES:  ?Allergies  ?Allergen Reactions  ? Sulfa Antibiotics Nausea Only and Swelling  ?  SWELLING REACTION UNSPECIFIED   ? ? ?PHYSICAL EXAM:  ?Performance status (ECOG): 1 - Symptomatic but completely ambulatory ? ?There were no vitals filed for this  visit. ?Wt Readings from Last 3 Encounters:  ?08/09/20 298 lb 1.6 oz (135.2 kg)  ?05/02/20 (!) 310 lb 1.6 oz (140.7 kg)  ?04/06/19 (!) 304 lb 3.2 oz (138 kg)  ? ?Physical Exam ?Vitals reviewed.  ?Constitutional:   ?   Appearance: Normal appearance.  ?Cardiovascular:  ?   Rate and Rhythm: Normal rate and regular rhythm.  ?   Pulses: Normal pulses.  ?   Heart sounds: Normal heart sounds.  ?Pulmonary:  ?   Effort: Pulmonary effort is normal.  ?   Breath sounds: Normal breath sounds.  ?Neurological:  ?   General: No focal deficit present.  ?   Mental Status: She is alert and  oriented to person, place, and time.  ?Psychiatric:     ?   Mood and Affect: Mood normal.     ?   Behavior: Behavior normal.  ?  ? ?LABORATORY DATA:  ?I have reviewed the labs as listed.  ? ?  Latest Ref Rng & Units 08/09/2021  ? 12:06 PM 08/02/2020  ?  9:55 AM 04/25/2020  ?  1:58 PM  ?CBC  ?WBC 4.0 - 10.5 K/uL 5.6   5.2   6.0    ?Hemoglobin 12.0 - 15.0 g/dL 11.7   12.2   13.0    ?Hematocrit 36.0 - 46.0 % 37.0   38.1   40.3    ?Platelets 150 - 400 K/uL 221   241   237    ? ? ?  Latest Ref Rng & Units 08/09/2021  ? 12:06 PM 08/02/2020  ?  9:55 AM 04/25/2020  ?  1:58 PM  ?CMP  ?Glucose 70 - 99 mg/dL 104   186   184    ?BUN 6 - 20 mg/dL 19   22   18     ?Creatinine 0.44 - 1.00 mg/dL 1.00   1.11   0.91    ?Sodium 135 - 145 mmol/L 141   137   135    ?Potassium 3.5 - 5.1 mmol/L 4.1   4.0   4.0    ?Chloride 98 - 111 mmol/L 107   100   100    ?CO2 22 - 32 mmol/L 27   28   25     ?Calcium 8.9 - 10.3 mg/dL 8.9   9.0   8.7    ?Total Protein 6.5 - 8.1 g/dL 6.5   6.5   6.5    ?Total Bilirubin 0.3 - 1.2 mg/dL 0.4   0.9   0.4    ?Alkaline Phos 38 - 126 U/L 73   70   69    ?AST 15 - 41 U/L 21   30   30     ?ALT 0 - 44 U/L 34   41   45    ? ? ?DIAGNOSTIC IMAGING:  ?I have independently reviewed the scans and discussed with the patient. ?CT Chest Wo Contrast ? ?Result Date: 08/12/2021 ?CLINICAL DATA:  Follow-up left lung nodule, left upper lobe wedge resection with benign  pathology EXAM: CT CHEST WITHOUT CONTRAST TECHNIQUE: Multidetector CT imaging of the chest was performed following the standard protocol without IV contrast. RADIATION DOSE REDUCTION: This exam was performed according to th

## 2022-01-30 ENCOUNTER — Other Ambulatory Visit: Payer: Self-pay | Admitting: Family Medicine

## 2022-01-30 DIAGNOSIS — Z1231 Encounter for screening mammogram for malignant neoplasm of breast: Secondary | ICD-10-CM

## 2022-02-01 ENCOUNTER — Encounter (HOSPITAL_COMMUNITY): Payer: Self-pay

## 2022-02-01 ENCOUNTER — Emergency Department (HOSPITAL_COMMUNITY)
Admission: EM | Admit: 2022-02-01 | Discharge: 2022-02-01 | Disposition: A | Discharge status: Home / Self Care | Payer: PRIVATE HEALTH INSURANCE | Attending: Emergency Medicine | Admitting: Emergency Medicine

## 2022-02-01 ENCOUNTER — Other Ambulatory Visit: Payer: Self-pay

## 2022-02-01 DIAGNOSIS — R059 Cough, unspecified: Secondary | ICD-10-CM | POA: Diagnosis not present

## 2022-02-01 DIAGNOSIS — J029 Acute pharyngitis, unspecified: Secondary | ICD-10-CM | POA: Diagnosis not present

## 2022-02-01 DIAGNOSIS — R112 Nausea with vomiting, unspecified: Secondary | ICD-10-CM | POA: Diagnosis not present

## 2022-02-01 DIAGNOSIS — R509 Fever, unspecified: Secondary | ICD-10-CM | POA: Diagnosis present

## 2022-02-01 DIAGNOSIS — Z1152 Encounter for screening for COVID-19: Secondary | ICD-10-CM | POA: Diagnosis not present

## 2022-02-01 DIAGNOSIS — R0981 Nasal congestion: Secondary | ICD-10-CM | POA: Diagnosis not present

## 2022-02-01 DIAGNOSIS — R197 Diarrhea, unspecified: Secondary | ICD-10-CM | POA: Insufficient documentation

## 2022-02-01 LAB — URINALYSIS, ROUTINE W REFLEX MICROSCOPIC
Bilirubin Urine: NEGATIVE
Glucose, UA: NEGATIVE mg/dL
Hgb urine dipstick: NEGATIVE
Ketones, ur: NEGATIVE mg/dL
Leukocytes,Ua: NEGATIVE
Nitrite: NEGATIVE
Protein, ur: 100 mg/dL — AB
Specific Gravity, Urine: 1.025 (ref 1.005–1.030)
pH: 5 (ref 5.0–8.0)

## 2022-02-01 LAB — BASIC METABOLIC PANEL
Anion gap: 8 (ref 5–15)
BUN: 16 mg/dL (ref 6–20)
CO2: 27 mmol/L (ref 22–32)
Calcium: 8.5 mg/dL — ABNORMAL LOW (ref 8.9–10.3)
Chloride: 100 mmol/L (ref 98–111)
Creatinine, Ser: 0.98 mg/dL (ref 0.44–1.00)
GFR, Estimated: >60 mL/min (ref >60)
Glucose, Bld: 206 mg/dL — ABNORMAL HIGH (ref 70–99)
Potassium: 4.4 mmol/L (ref 3.5–5.1)
Sodium: 135 mmol/L (ref 135–145)

## 2022-02-01 LAB — CBC WITH DIFFERENTIAL/PLATELET
Abs Immature Granulocytes: 0.03 10*3/uL (ref 0.00–0.07)
Basophils Absolute: 0 10*3/uL (ref 0.0–0.1)
Basophils Relative: 0 %
Eosinophils Absolute: 0 10*3/uL (ref 0.0–0.5)
Eosinophils Relative: 0 %
HCT: 39.3 % (ref 36.0–46.0)
Hemoglobin: 12.9 g/dL (ref 12.0–15.0)
Immature Granulocytes: 0 %
Lymphocytes Relative: 6 %
Lymphs Abs: 0.6 10*3/uL — ABNORMAL LOW (ref 0.7–4.0)
MCH: 27 pg (ref 26.0–34.0)
MCHC: 32.8 g/dL (ref 30.0–36.0)
MCV: 82.4 fL (ref 80.0–100.0)
Monocytes Absolute: 0.7 10*3/uL (ref 0.1–1.0)
Monocytes Relative: 6 %
Neutro Abs: 9.1 10*3/uL — ABNORMAL HIGH (ref 1.7–7.7)
Neutrophils Relative %: 88 %
Platelets: 213 10*3/uL (ref 150–400)
RBC: 4.77 MIL/uL (ref 3.87–5.11)
RDW: 15 % (ref 11.5–15.5)
WBC: 10.5 10*3/uL (ref 4.0–10.5)
nRBC: 0 % (ref 0.0–0.2)

## 2022-02-01 LAB — RESP PANEL BY RT-PCR (FLU A&B, COVID) ARPGX2
Influenza A by PCR: NEGATIVE
Influenza B by PCR: NEGATIVE
SARS Coronavirus 2 by RT PCR: NEGATIVE

## 2022-02-01 MED ORDER — KETOROLAC TROMETHAMINE 15 MG/ML IJ SOLN
15.0000 mg | Freq: Once | INTRAMUSCULAR | Status: AC
  Administered 2022-02-01: 15 mg via INTRAVENOUS
  Filled 2022-02-01: qty 1

## 2022-02-01 MED ORDER — ONDANSETRON HCL 4 MG/2ML IJ SOLN
4.0000 mg | Freq: Once | INTRAMUSCULAR | Status: AC
  Administered 2022-02-01: 4 mg via INTRAVENOUS
  Filled 2022-02-01: qty 2

## 2022-02-01 MED ORDER — LACTATED RINGERS IV BOLUS
1000.0000 mL | Freq: Once | INTRAVENOUS | Status: AC
  Administered 2022-02-01: 1000 mL via INTRAVENOUS

## 2022-02-01 NOTE — ED Triage Notes (Signed)
Patient states nausea, weakness, abdominal cramps with diarrhea and one episode of vomiting and increased fatigue.

## 2022-02-01 NOTE — ED Provider Notes (Signed)
[facility] Provider Note   CSN: [id number] Arrival date & time: 02/01/22  0831     History Chief Complaint  Patient presents with   URI   Emesis    HPI Tanya Galloway is a 56 y.o. female presenting for multiple complaints, she is endorsing cough, sore throat, nausea vomiting, diarrhea all of which has been present for approximately 24 hours.  She denies chest pain, syncope, shortness of breath.  She is started drinking less water because it is causing her to go to the bathroom more frequently with nonbilious nonbloody bowel movements approximately 5 today.  She denies any abdominal pain to go with her symptoms.  Patient's recorded medical, surgical, social, medication list and allergies were reviewed in the Snapshot window as part of the initial history.   Review of Systems   Review of Systems  Constitutional:  Positive for fever. Negative for chills.  HENT:  Negative for ear pain and sore throat.   Eyes:  Negative for pain and visual disturbance.  Respiratory:  Negative for cough and shortness of breath.   Cardiovascular:  Negative for chest pain and palpitations.  Gastrointestinal:  Positive for diarrhea, nausea and vomiting. Negative for abdominal pain.  Genitourinary:  Negative for dysuria and hematuria.  Musculoskeletal:  Negative for arthralgias and back pain.  Skin:  Negative for color change and rash.  Neurological:  Negative for seizures and syncope.  All other systems reviewed and are negative.   Physical Exam Updated Vital Signs BP (!) 150/82   Pulse (!) 116   Temp 98.5 F (36.9 C) (Oral)   Resp 18   Ht 5\' 9"  (1.753 m)   Wt 136.1 kg   LMP 05/09/2018   SpO2 100%   BMI 44.30 kg/m  Physical Exam Vitals and nursing note reviewed.  Constitutional:      General: She is not in acute distress.    Appearance: She is well-developed.  HENT:     Head: Normocephalic and atraumatic.  Eyes:     Conjunctiva/sclera: Conjunctivae normal.   Cardiovascular:     Rate and Rhythm: Normal rate and regular rhythm.     Heart sounds: No murmur heard. Pulmonary:     Effort: Pulmonary effort is normal. No respiratory distress.     Breath sounds: Normal breath sounds.  Abdominal:     Palpations: Abdomen is soft.     Tenderness: There is no abdominal tenderness.  Musculoskeletal:        General: No swelling.     Cervical back: Neck supple.  Skin:    General: Skin is warm and dry.     Capillary Refill: Capillary refill takes less than 2 seconds.  Neurological:     Mental Status: She is alert.  Psychiatric:        Mood and Affect: Mood normal.      ED Course/ Medical Decision Making/ A&P    Procedures Procedures   Medications Ordered in ED Medications  lactated ringers bolus 1,000 mL (1,000 mLs Intravenous New Bag/Given 02/01/22 1006)  ketorolac (TORADOL) 15 MG/ML injection 15 mg (15 mg Intravenous Given 02/01/22 1008)  ondansetron (ZOFRAN) injection 4 mg (4 mg Intravenous Given 02/01/22 1007)   Medical Decision Making:   [name] is a 56 y.o. female who presented to the ED today with subjective fever, cough, congestion detailed above.    Patient placed on continuous vitals and telemetry monitoring while in ED which was reviewed periodically.   Complete initial physical  exam performed, notably the patient  was hemodynamically stable in no acute distress.  Posterior oropharynx illuminated and without obvious swelling or deformity.  Patient is without neck stiffness.    Reviewed and confirmed nursing documentation for past medical history, family history, social history.    Initial Assessment:   With the patient's presentation of fever cough congestion, most likely diagnosis is developing viral upper respiratory infection. Other diagnoses were considered including (but not limited to) peritonsillar abscess, retropharyngeal abscess, pneumonia. These are considered less likely due to history of present illness and physical  exam findings.   This is most consistent with an acute life/limb threatening illness complicated by underlying chronic conditions. Considered meningitis, however patient's symptoms, vital signs, physical exam findings including lack of meningismus seem grossly less consistent at this time. Initial Plan:  Screening labs including CBC and Metabolic panel to evaluate for infectious or metabolic etiology of disease.  Viral screening including COVID/flu testing to evaluate for common viral etiologies that need to be tracked Empiric treatment with antipyretics including acetaminophen in ambulatory setting Will augment with dose of ketorolac in ED  Objective evaluation as below reviewed   Initial Study Results:   Laboratory  All laboratory results reviewed without evidence of clinically relevant pathology.         Final Assessment and Plan:   On reassessment, patient is ambulatory tolerating p.o. intake in no acute distress.  Patient has had near complete resolution of symptoms.  Is sitting upright ambulatory tolerating p.o. intake.  Given improvement of symptoms, believe patient will benefit from a trial of outpatient Zofran, follow-up with PCP for reassessment of likely developing gastroenteritis versus nonspecific etiology.  Strict return precautions regarding clinical overlap with appendicitis or biliary disease reinforced with the patient expressed understanding and patient was discharged with no further acute events. Patient's COVID test is negative. Patient is currently stable for outpatient care and management with no indication for hospitalization or transfer at this time.  Discussed all findings with patient expressed understanding.  Disposition:  Based on the above findings, I believe patient is stable for discharge.    Patient/family educated about specific return precautions for given chief complaint and symptoms.  Patient/family educated about follow-up with PCP.     Patient/family  expressed understanding of return precautions and need for follow-up. Patient spoken to regarding all imaging and laboratory results and appropriate follow up for these results. All education provided in verbal form with additional information in written form. Time was allowed for answering of patient questions. Patient discharged.    Emergency Department Medication Summary:   Medications  lactated ringers bolus 1,000 mL (1,000 mLs Intravenous New Bag/Given 02/01/22 1006)  ketorolac (TORADOL) 15 MG/ML injection 15 mg (15 mg Intravenous Given 02/01/22 1008)  ondansetron (ZOFRAN) injection 4 mg (4 mg Intravenous Given 02/01/22 1007)           Clinical Impression: No diagnosis found.   Data Unavailable  Clinical Impression: No diagnosis found.   Data Unavailable   Final Clinical Impression(s) / ED Diagnoses Final diagnoses:  None    Rx / DC Orders ED Discharge Orders     None         Tretha Sciara, MD 02/01/22 1125

## 2022-02-06 ENCOUNTER — Ambulatory Visit
Admission: RE | Admit: 2022-02-06 | Discharge: 2022-02-06 | Disposition: A | Discharge status: Home / Self Care | Payer: PRIVATE HEALTH INSURANCE | Source: Ambulatory Visit | Attending: Family Medicine | Admitting: Family Medicine

## 2022-02-06 DIAGNOSIS — Z1231 Encounter for screening mammogram for malignant neoplasm of breast: Secondary | ICD-10-CM

## 2022-02-24 ENCOUNTER — Encounter (INDEPENDENT_AMBULATORY_CARE_PROVIDER_SITE_OTHER): Payer: Self-pay | Admitting: Gastroenterology

## 2022-03-27 ENCOUNTER — Ambulatory Visit: Payer: PRIVATE HEALTH INSURANCE | Admitting: Gastroenterology

## 2022-04-09 ENCOUNTER — Ambulatory Visit (INDEPENDENT_AMBULATORY_CARE_PROVIDER_SITE_OTHER): Payer: PRIVATE HEALTH INSURANCE | Admitting: Gastroenterology

## 2022-04-09 ENCOUNTER — Encounter: Payer: Self-pay | Admitting: Gastroenterology

## 2022-04-09 VITALS — BP 116/74 | HR 91 | Temp 97.9°F | Ht 67.0 in | Wt 291.0 lb

## 2022-04-09 DIAGNOSIS — R109 Unspecified abdominal pain: Secondary | ICD-10-CM

## 2022-04-09 DIAGNOSIS — Z8719 Personal history of other diseases of the digestive system: Secondary | ICD-10-CM | POA: Insufficient documentation

## 2022-04-09 DIAGNOSIS — K580 Irritable bowel syndrome with diarrhea: Secondary | ICD-10-CM

## 2022-04-09 DIAGNOSIS — R112 Nausea with vomiting, unspecified: Secondary | ICD-10-CM | POA: Diagnosis not present

## 2022-04-09 NOTE — Patient Instructions (Signed)
I recommend starting Benefiber 2 teaspoons mixed in the beverage of your choice daily!  You can try and wean off dicyclomine as tolerated.  We can consider a course of Xifaxan in the future for IBS.  We will see you in 6 months!  It was a pleasure to see you today. I want to create trusting relationships with patients to provide genuine, compassionate, and quality care. I value your feedback. If you receive a survey regarding your visit,  I greatly appreciate you taking time to fill this out.   Annitta Needs, PhD, ANP-BC [facility]

## 2022-04-09 NOTE — Progress Notes (Addendum)
Gastroenterology Office Note    Referring Provider: Lovey Newcomer, PA Primary Care Physician:  Lovey Newcomer, PA  Primary GI: Dr. Levon Hedger    Chief Complaint   Chief Complaint  Patient presents with   New Patient (Initial Visit)    Pt referred for LLQ pain and diverticulitis     History of Present Illness   Tanya Galloway is a 57 y.o. female presenting today at the request of Lovey Newcomer, [name] due to recent episode of abdominal pain, N/V/D and treated empirically for diverticulitis. She was last seen in office in 2018. History of chronic GERD and IBS-D.    Notes onset of acute N/V/D, LLQ abdominal pain and suprapubic pain before Thanksgiving. Was seen in the ED but no imaging completed. Was out of work a week. Went to PCP and had Abdominal xray Feb 03, 2022 at outside facility: fluid-filled, non-distended small bowel loops. Prescribed Augmentin empirically by PCP. Improvement in symptoms noted.   Feels much improved and normal today. Afebrile. Occasional LLQ twinges but otherwise resolved. No N/V. Back to baseline bowel habits of more frequent stools. History of IBS with diarrhea predominant. Takes dicyclomine BID chronically, which controls symptoms. No overt GI bleeding.  GERD controlled on Nexium. No dysphagia.    Last colonoscopy Dec 2019 by Dr. Karilyn Cota: seven 4-6 mm polyps , sigmoid diverticulosis. 4 tubular adenomas 1 sessile serrated polyp and 2 are hyperplastic polyps   Past Medical History:  Diagnosis Date   Diabetes mellitus without complication (HCC)    Diverticulosis    Fatty liver    GERD (gastroesophageal reflux disease)    H/O seasonal allergies    Hemorrhoids    Hypertension    Irritable bowel syndrome    Lung cancer [facility])     Past Surgical History:  Procedure Laterality Date   COLONOSCOPY N/A 03/11/2018   seven 4-6 mm polyps , sigmoid diverticulosis. 4 tubular adenomas 1 sessile serrated polyp and 2 are hyperplastic polyps   KNEE  ARTHROSCOPY  04/07/2009   left knee   LOBECTOMY Left 01/11/2019   Procedure: possible LOBECTOMY;  Surgeon: Corliss Skains, MD;  Location: [facility] OR;  Service: Thoracic;  Laterality: Left;   POLYPECTOMY  03/11/2018   Procedure: POLYPECTOMY;  Surgeon: Malissa Hippo, MD;  Location: [facility];  Service: Endoscopy;;  splenic flexure (CSx1), transverse colon (CBx1), hepatic flexure (CSx1), descending colon (CSx1)distal sigmoid colon(CSx3)   VIDEO ASSISTED THORACOSCOPY (VATS)/WEDGE RESECTION Left 01/11/2019   Procedure: VIDEO ASSISTED THORACOSCOPY (VATS)/LEFT UPPER LOBE WEDGE RESECTION;  Surgeon: Corliss Skains, MD;  Location: [facility] OR;  Service: Thoracic;  Laterality: Left;   VIDEO BRONCHOSCOPY N/A 01/11/2019   Procedure: VIDEO BRONCHOSCOPY;  Surgeon: Corliss Skains, MD;  Location: [facility] OR;  Service: Thoracic;  Laterality: N/A;    Current Outpatient Medications  Medication Sig Dispense Refill   acetaminophen (TYLENOL) 500 MG tablet Take 2 tablets (1,000 mg total) by mouth every 6 (six) hours. (Patient taking differently: Take 1,000 mg by mouth every 8 (eight) hours as needed for mild pain, moderate pain or headache.) 30 tablet 0   calcipotriene-betamethasone (TACLONEX SCALP) external suspension Apply 6 drops topically daily.     Cholecalciferol (VITAMIN D3) 50 MCG (2000 UT) TABS Take 1 tablet by mouth in the morning and at bedtime.     clobetasol (TEMOVATE) 0.05 % external solution Apply 1 application  topically 2 (two) times daily as needed (psorasis).     dicyclomine (BENTYL)  10 MG capsule Take 1 capsule (10 mg total) by mouth 2 (two) times daily before a meal. 180 capsule 3   esomeprazole (NEXIUM) 40 MG capsule Take 40 mg by mouth daily.     ketoconazole (NIZORAL) 2 % shampoo Apply 1 application topically 2 (two) times a week.     Lancets (ONETOUCH DELICA PLUS LANCET30G) MISC USE TO TEST FASTING BLOOD SUGAR D     lisinopril-hydrochlorothiazide (ZESTORETIC) 20-12.5 MG tablet Take  0.5 tablets by mouth daily.     metFORMIN (GLUCOPHAGE-XR) 500 MG 24 hr tablet Take 1,000 mg by mouth 2 (two) times daily with a meal.  2   No current facility-administered medications for this visit.    Allergies as of 04/09/2022 - Review Complete 04/09/2022  Allergen Reaction Noted   Sulfa antibiotics Nausea Only and Swelling 03/08/2018    Family History  Problem Relation Age of Onset   Diabetes Mother    Thyroid disease Mother    Hypertension Mother    Hypertension Father    Hypertension Brother    Healthy Brother    Breast cancer Maternal Grandmother    Colon cancer Neg Hx     Social History   Socioeconomic History   Marital status: Married    Spouse name: Kyndell Zeiser.   Number of children: Not on file   Years of education: Not on file   Highest education level: Not on file  Occupational History   Not on file  Tobacco Use   Smoking status: Former    Types: Cigarettes    Quit date: 09/15/2009    Years since quitting: 12.5   Smokeless tobacco: Never   Tobacco comments:    Patient smoked for 25 years, 1 1/2 ppd  Vaping Use   Vaping Use: Never used  Substance and Sexual Activity   Alcohol use: Yes    Comment: Twice a Year   Drug use: No   Sexual activity: Not on file  Other Topics Concern   Not on file  Social History Narrative   Not on file   Social Determinants of Health   Financial Resource Strain: Low Risk  (11/24/2018)   Overall Financial Resource Strain (CARDIA)    Difficulty of Paying Living Expenses: Not hard at all  Food Insecurity: No Food Insecurity (11/24/2018)   Hunger Vital Sign    Worried About Running Out of Food in the Last Year: Never true    Ran Out of Food in the Last Year: Never true  Transportation Needs: No Transportation Needs (11/24/2018)   PRAPARE - Administrator, Civil Service (Medical): No    Lack of Transportation (Non-Medical): No  Physical Activity: Insufficiently Active (11/24/2018)   Exercise Vital Sign     Days of Exercise per Week: 1 day    Minutes of Exercise per Session: 10 min  Stress: No Stress Concern Present (11/24/2018)   [employer] of Occupational Health - Occupational Stress Questionnaire    Feeling of Stress : Only a little  Social Connections: Moderately Integrated (11/24/2018)   Social Connection and Isolation Panel [NHANES]    Frequency of Communication with Friends and Family: More than three times a week    Frequency of Social Gatherings with Friends and Family: More than three times a week    Attends Religious Services: More than 4 times per year    Active Member of [employer] or Organizations: No    Attends Banker Meetings: Never    Marital Status:  Married  Intimate Partner Violence: Not At Risk (11/24/2018)   Humiliation, Afraid, Rape, and Kick questionnaire    Fear of Current or Ex-Partner: No    Emotionally Abused: No    Physically Abused: No    Sexually Abused: No     Review of Systems   Gen: Denies any fever, chills, fatigue, weight loss, lack of appetite.  CV: Denies chest pain, heart palpitations, peripheral edema, syncope.  Resp: Denies shortness of breath at rest or with exertion. Denies wheezing or cough.  GI: Denies dysphagia or odynophagia. Denies jaundice, hematemesis, fecal incontinence. GU : Denies urinary burning, urinary frequency, urinary hesitancy MS: Denies joint pain, muscle weakness, cramps, or limitation of movement.  Derm: Denies rash, itching, dry skin Psych: Denies depression, anxiety, memory loss, and confusion Heme: Denies bruising, bleeding, and enlarged lymph nodes.   Physical Exam   BP 116/74   Pulse 91   Temp 97.9 F (36.6 C)   Ht 5\' 7"  (1.702 m)   Wt 291 lb (132 kg)   LMP 05/09/2018   BMI 45.58 kg/m  General:   Alert and oriented. Pleasant and cooperative. Well-nourished and well-developed.  Head:  Normocephalic and atraumatic. Eyes:  Without icterus Ears:  Normal auditory acuity. Lungs:  Clear to  auscultation bilaterally.  Heart:  S1, S2 present without murmurs appreciated.  Abdomen:  +BS, soft, non-tender and non-distended. No HSM noted. No guarding or rebound. No masses appreciated.  Rectal:  Deferred  Msk:  Symmetrical without gross deformities. Normal posture. Extremities:  Without edema. Neurologic:  Alert and  oriented x4;  grossly normal neurologically. Skin:  Intact without significant lesions or rashes. Psych:  Alert and cooperative. Normal mood and affect.   Assessment   Tanya Galloway is a 57 y.o. female presenting today  today at the request of 55, Lovey Newcomer due to recent episode of abdominal pain, N/V/D and treated empirically for diverticulitis. She was last seen in office in 2018. History of chronic GERD and IBS-D.   Presumed diverticulitis: no imaging on file. No alarm signs/symptoms. Clinically improved with course of abx from PCP. Discussed supplemental fiber in addition to avoidance of constipation. As no imaging on file, will keep plans for Dec 2024 colonoscopy unless any changes.   GERD: controlled on Nexium daily.  IBS-D: on dicyclomine BID chronically. We discussed course of Xifaxan to be considered in the future. However, with recent abx exposure, I would like to hold off on this and discuss at next visit if needed.   History of polyps: Dec 2024 surveillance barring any clinical changes.     PLAN   Benefiber daily  Continue dicyclomine prn  Return in 61months  Colonoscopy Dec 2024   Jan 2025, PhD, ANP-BC [facility]   I have reviewed the note and agree with the APP's assessment as described in this progress note  If patient were to have recurrence of symptoms, will need to proceed with CT abdomen and pelvis with IV contrast to rule out inflammatory changes in SB or colon.  [facility], MD Gastroenterology and Hepatology [facility]

## 2022-04-18 DIAGNOSIS — R112 Nausea with vomiting, unspecified: Secondary | ICD-10-CM | POA: Insufficient documentation

## 2022-04-18 DIAGNOSIS — R109 Unspecified abdominal pain: Secondary | ICD-10-CM | POA: Insufficient documentation

## 2022-08-18 ENCOUNTER — Ambulatory Visit (HOSPITAL_COMMUNITY): Payer: PRIVATE HEALTH INSURANCE

## 2022-08-18 ENCOUNTER — Inpatient Hospital Stay: Payer: PRIVATE HEALTH INSURANCE

## 2022-08-25 ENCOUNTER — Inpatient Hospital Stay: Payer: PRIVATE HEALTH INSURANCE | Admitting: Hematology

## 2022-08-28 ENCOUNTER — Emergency Department (HOSPITAL_COMMUNITY): Payer: BC Managed Care – PPO

## 2022-08-28 ENCOUNTER — Emergency Department (HOSPITAL_COMMUNITY)
Admission: EM | Admit: 2022-08-28 | Discharge: 2022-08-28 | Disposition: A | Discharge status: Home / Self Care | Payer: BC Managed Care – PPO | Attending: Emergency Medicine | Admitting: Emergency Medicine

## 2022-08-28 ENCOUNTER — Other Ambulatory Visit: Payer: Self-pay

## 2022-08-28 ENCOUNTER — Encounter (HOSPITAL_COMMUNITY): Payer: Self-pay | Admitting: Emergency Medicine

## 2022-08-28 DIAGNOSIS — M25561 Pain in right knee: Secondary | ICD-10-CM | POA: Diagnosis not present

## 2022-08-28 DIAGNOSIS — Z79899 Other long term (current) drug therapy: Secondary | ICD-10-CM | POA: Insufficient documentation

## 2022-08-28 DIAGNOSIS — S301XXA Contusion of abdominal wall, initial encounter: Secondary | ICD-10-CM | POA: Insufficient documentation

## 2022-08-28 DIAGNOSIS — S3991XA Unspecified injury of abdomen, initial encounter: Secondary | ICD-10-CM | POA: Diagnosis present

## 2022-08-28 DIAGNOSIS — I10 Essential (primary) hypertension: Secondary | ICD-10-CM | POA: Diagnosis not present

## 2022-08-28 DIAGNOSIS — W19XXXA Unspecified fall, initial encounter: Secondary | ICD-10-CM | POA: Diagnosis not present

## 2022-08-28 LAB — I-STAT CHEM 8, ED
BUN: 23 mg/dL — ABNORMAL HIGH (ref 6–20)
Calcium, Ion: 1.19 mmol/L (ref 1.15–1.40)
Chloride: 99 mmol/L (ref 98–111)
Creatinine, Ser: 1.3 mg/dL — ABNORMAL HIGH (ref 0.44–1.00)
Glucose, Bld: 154 mg/dL — ABNORMAL HIGH (ref 70–99)
HCT: 35 % — ABNORMAL LOW (ref 36.0–46.0)
Hemoglobin: 11.9 g/dL — ABNORMAL LOW (ref 12.0–15.0)
Potassium: 3.9 mmol/L (ref 3.5–5.1)
Sodium: 139 mmol/L (ref 135–145)
TCO2: 31 mmol/L (ref 22–32)

## 2022-08-28 MED ORDER — IOHEXOL 300 MG/ML  SOLN
100.0000 mL | Freq: Once | INTRAMUSCULAR | Status: AC | PRN
  Administered 2022-08-28: 100 mL via INTRAVENOUS

## 2022-08-28 NOTE — Discharge Instructions (Addendum)
You were seen in the emergency department for a mechanical fall. Thankfully your imaging was all reasurring with acute signs of any fractures, dislocations, or internal abnormalities in the abdomen. I would advise following up with your primary care provider for further evaluation to ensure your symptoms continue to resolve without any complications. If you feel that your symptoms are worsening please return the emergency department for further evaluation.

## 2022-08-28 NOTE — ED Provider Notes (Signed)
[place] EMERGENCY DEPARTMENT AT [facility] Provider Note   CSN: [id number] Arrival date & time: 08/28/22  1748     History Chief Complaint  Patient presents with   Tanya Galloway    Tanya Galloway is a 57 y.o. female.  Patient with past history significant for hypertension, obesity, IBS, left lung nodule presents emergency department following a mechanical fall.  She reports she was walking up the steps on her front porch when she fell resulting in a fall striking the abdomen, chest, face.  Denies loss of consciousness.  Denies any headache, nausea, vomiting.  Not currently on any blood thinners.  Patient able to ambulate without significant difficulty on arrival in the emergency department.   Fall       Home Medications Prior to Admission medications   Medication Sig Start Date End Date Taking? Authorizing Provider  acetaminophen (TYLENOL) 500 MG tablet Take 2 tablets (1,000 mg total) by mouth every 6 (six) hours. Patient taking differently: Take 500-1,000 mg by mouth every 8 (eight) hours as needed for mild pain, moderate pain or headache. 01/12/19  Yes Conte, Tessa N, PA-C  B Complex-C (SUPER B COMPLEX PO) Take by mouth.   Yes [provider]  calcipotriene-betamethasone (TACLONEX SCALP) external suspension Apply 6 drops topically daily.   Yes [provider]  Cholecalciferol (VITAMIN D3) 50 MCG (2000 UT) TABS Take 1 tablet by mouth in the morning and at bedtime.   Yes [provider]  clobetasol (TEMOVATE) 0.05 % external solution Apply 1 application  topically 2 (two) times daily as needed (psorasis). 01/20/19  Yes [provider]  Cyanocobalamin (VITAMIN B-12 CR PO) Take by mouth.   Yes [provider]  dicyclomine (BENTYL) 10 MG capsule Take 1 capsule (10 mg total) by mouth 2 (two) times daily before a meal. 07/18/13  Yes Rehman, Joline Maxcy, MD  esomeprazole (NEXIUM) 40 MG capsule Take 40 mg by mouth daily. 05/22/20  Yes [provider]  ketoconazole (NIZORAL) 2 % shampoo Apply 1 application topically 2 (two) times a week.   Yes [provider]  lisinopril-hydrochlorothiazide (ZESTORETIC) 20-12.5 MG tablet Take 1 tablet by mouth daily. 03/26/19  Yes [provider]  metFORMIN (GLUCOPHAGE-XR) 500 MG 24 hr tablet Take 1,000 mg by mouth 2 (two) times daily with a meal. 01/27/18  Yes [provider]  Pediatric Multiple Vitamins (FLINSTONES GUMMIES OMEGA-3 DHA PO) Take by mouth.   Yes [provider]  Lancets (ONETOUCH DELICA PLUS LANCET30G) MISC USE TO TEST FASTING BLOOD SUGAR D 01/19/19   [provider]      Allergies    Sulfa antibiotics    Review of Systems   Review of Systems  Musculoskeletal:  Positive for back pain.  All other systems reviewed and are negative.   Physical Exam Updated Vital Signs BP (!) 137/92   Pulse 82   Temp 98.9 F (37.2 C) (Oral)   Resp 18   Ht 5\' 7"  (1.702 m)   Wt 136.1 kg   LMP 05/09/2018   SpO2 96%   BMI 46.99 kg/m  Physical Exam Vitals and nursing note reviewed.  Constitutional:      General: She is not in acute distress.    Appearance: She is well-developed.  HENT:     Head: Normocephalic and atraumatic.  Eyes:     Conjunctiva/sclera: Conjunctivae normal.  Cardiovascular:     Rate and Rhythm: Normal rate and regular rhythm.     Heart sounds:  No murmur heard. Pulmonary:     Effort: Pulmonary effort is normal. No respiratory distress.     Breath sounds: Normal breath sounds.  Abdominal:     Palpations: Abdomen is soft.     Tenderness: There is no abdominal tenderness.  Musculoskeletal:        General: Tenderness and signs of injury present. No swelling or deformity.     Cervical back: Neck supple.  Skin:    General: Skin is warm and dry.     Capillary Refill: Capillary refill takes less than 2 seconds.     Findings: Bruising present.          Comments: 2 notable sites of ecchymosis in the abdomen.   Negative Cullen sign.  Neurological:     Mental Status: She is alert.  Psychiatric:        Mood and Affect: Mood normal.     ED Results / Procedures / Treatments   Labs (all labs ordered are listed, but only abnormal results are displayed) Labs Reviewed  I-STAT CHEM 8, ED - Abnormal; Notable for the following components:      Result Value   BUN 23 (*)    Creatinine, Ser 1.30 (*)    Glucose, Bld 154 (*)    Hemoglobin 11.9 (*)    HCT 35.0 (*)    All other components within normal limits    EKG None  Radiology CT ABDOMEN PELVIS W CONTRAST  Result Date: 08/28/2022 CLINICAL DATA:  Blunt abdominal trauma. EXAM: CT ABDOMEN AND PELVIS WITH CONTRAST TECHNIQUE: Multidetector CT imaging of the abdomen and pelvis was performed using the standard protocol following bolus administration of intravenous contrast. RADIATION DOSE REDUCTION: This exam was performed according to the departmental dose-optimization program which includes automated exposure control, adjustment of the mA and/or kV according to patient size and/or use of iterative reconstruction technique. CONTRAST:  OMNIPAQUE IOHEXOL 300 MG/ML  SOLN COMPARISON:  CT abdomen pelvis dated 02/17/2019. FINDINGS: Lower chest: The visualized lung bases are clear. No intra-abdominal free air or free fluid. Hepatobiliary: Fatty liver. No biliary dilatation. No calcified gallstone or pericholecystic fluid. Pancreas: Unremarkable. No pancreatic ductal dilatation or surrounding inflammatory changes. Spleen: Normal in size without focal abnormality. Adrenals/Urinary Tract: The adrenal glands are unremarkable. The kidneys, visualized ureters, and urinary bladder appear unremarkable. Stomach/Bowel: There is sigmoid diverticulosis without active inflammatory changes. There is no bowel obstruction or active inflammation. The appendix is normal. Vascular/Lymphatic: The abdominal aorta and IVC are unremarkable. No portal venous gas. There is no adenopathy.  Reproductive: The uterus is anteverted.  No adnexal masses. Other: None Musculoskeletal: Degenerative changes of the lower lumbar spine. No acute osseous pathology. IMPRESSION: 1. No acute intra-abdominal or pelvic pathology. 2. Sigmoid diverticulosis. No bowel obstruction. Normal appendix. 3. Fatty liver. Electronically Signed   By: Elgie Collard M.D.   On: 08/28/2022 20:28   DG Chest Portable 1 View  Result Date: 08/28/2022 CLINICAL DATA:  Status post fall. EXAM: PORTABLE CHEST 1 VIEW COMPARISON:  April 04, 2019 FINDINGS: The heart size and mediastinal contours are within normal limits. There is no evidence of an acute infiltrate, pleural effusion or pneumothorax. Small, stable radiopaque surgical clips are seen overlying the lateral aspect of the upper left lung. The visualized skeletal structures are unremarkable. IMPRESSION: No active disease. Electronically Signed   By: Aram Candela M.D.   On: 08/28/2022 19:50   DG Knee 2 Views Right  Result Date: 08/28/2022 CLINICAL DATA:  Right knee pain after  fall EXAM: RIGHT KNEE - 1-2 VIEW COMPARISON:  None Available. FINDINGS: No evidence of fracture, dislocation, or joint effusion. No evidence of arthropathy or other focal bone abnormality. Soft tissues are unremarkable. IMPRESSION: Negative. Electronically Signed   By: Minerva Fester M.D.   On: 08/28/2022 18:26    Procedures Procedures   Medications Ordered in ED Medications  iohexol (OMNIPAQUE) 300 MG/ML solution 100 mL (100 mLs Intravenous Contrast Given 08/28/22 2002)    ED Course/ Medical Decision Making/ A&P                           Medical Decision Making Amount and/or Complexity of Data Reviewed Radiology: ordered.  Risk Prescription drug management.   This patient presents to the ED for concern of fall.  Differential diagnosis includes concussion, syncope, blunt trauma, cervical strain, lumbar strain    Lab Tests:  I Ordered, and personally interpreted labs.  The  pertinent results include: I-STAT Chem-8 without significant anemia   Imaging Studies ordered:  I ordered imaging studies including CT abdomen pelvis with contrast, chest x-ray, right knee x-ray I independently visualized and interpreted imaging which showed no acute abdominal maladies, no acute fractures or dislocations I agree with the radiologist interpretation   Problem List / ED Course:  Patient presents emergency department with complaints of a mechanical fall at home.  She reports that she missed a step on her front porch and fell face first onto the ground.  Denies any obvious signs of head trauma such as headache, nausea, vomiting, confusion.  Patient does have some concerns regarding abdominal bruising from the fall itself.  Patient not currently on any blood thinners.  Given largely reassuring physical exam findings, performed imaging for further evaluation of possible injuries.  Thankfully patient's CT abdomen pelvis was reassuring without any signs of any acute abdominal trauma.  Informed patient of reassuring findings on CT and x-ray imaging.  Given the patient has remained hemodynamically stable without any acute decline in status, I believe the patient safe for discharge home and outpatient follow-up with primary care provider.  Encourage patient to take over-the-counter pain medications as needed for pain.  Patient is agreeable with this treatment plan and verbalized understanding all return precautions.  All questions answered prior to patient discharge.  Final Clinical Impression(s) / ED Diagnoses Final diagnoses:  Fall, initial encounter  Acute pain of right knee    Rx / DC Orders ED Discharge Orders     None         Salomon Mast 08/29/22 2137    Derwood Kaplan, MD 08/30/22 1523

## 2022-08-28 NOTE — ED Triage Notes (Signed)
Pt via POV from home c/o right knee pain, general abdominal pain, and facial pain after sustaining a mechanical fall at home. Pt reports bruising to abdomen with bilateral soreness and possible swelling, treated with 500mg  tylenol PTA. Pt has applied ice with no improvement. Dark blue and purple bruising noted to multiple areas of abdomen and pt says she feels increased soreness with inhalation. Pt denies LOC. Pt denies blood thinners. No additional symptoms noted since time of fall. Ambulatory to triage.

## 2022-09-09 ENCOUNTER — Other Ambulatory Visit: Payer: Self-pay

## 2022-09-09 DIAGNOSIS — E559 Vitamin D deficiency, unspecified: Secondary | ICD-10-CM

## 2022-09-09 DIAGNOSIS — R911 Solitary pulmonary nodule: Secondary | ICD-10-CM

## 2022-09-10 ENCOUNTER — Inpatient Hospital Stay: Payer: BC Managed Care – PPO | Attending: Hematology

## 2022-09-10 ENCOUNTER — Telehealth: Payer: Self-pay | Admitting: *Deleted

## 2022-09-10 DIAGNOSIS — Z87891 Personal history of nicotine dependence: Secondary | ICD-10-CM | POA: Diagnosis not present

## 2022-09-10 DIAGNOSIS — R918 Other nonspecific abnormal finding of lung field: Secondary | ICD-10-CM | POA: Insufficient documentation

## 2022-09-10 DIAGNOSIS — E559 Vitamin D deficiency, unspecified: Secondary | ICD-10-CM | POA: Insufficient documentation

## 2022-09-10 DIAGNOSIS — R911 Solitary pulmonary nodule: Secondary | ICD-10-CM

## 2022-09-10 LAB — COMPREHENSIVE METABOLIC PANEL
ALT: 41 U/L (ref 0–44)
AST: 31 U/L (ref 15–41)
Albumin: 3.6 g/dL (ref 3.5–5.0)
Alkaline Phosphatase: 100 U/L (ref 38–126)
Anion gap: 11 (ref 5–15)
BUN: 21 mg/dL — ABNORMAL HIGH (ref 6–20)
CO2: 24 mmol/L (ref 22–32)
Calcium: 9 mg/dL (ref 8.9–10.3)
Chloride: 99 mmol/L (ref 98–111)
Creatinine, Ser: 1.03 mg/dL — ABNORMAL HIGH (ref 0.44–1.00)
GFR, Estimated: >60 mL/min (ref >60)
Glucose, Bld: 219 mg/dL — ABNORMAL HIGH (ref 70–99)
Potassium: 4.3 mmol/L (ref 3.5–5.1)
Sodium: 134 mmol/L — ABNORMAL LOW (ref 135–145)
Total Bilirubin: 0.6 mg/dL (ref 0.3–1.2)
Total Protein: 6.5 g/dL (ref 6.5–8.1)

## 2022-09-10 LAB — CBC WITH DIFFERENTIAL/PLATELET
Abs Immature Granulocytes: 0.04 10*3/uL (ref 0.00–0.07)
Basophils Absolute: 0.1 10*3/uL (ref 0.0–0.1)
Basophils Relative: 1 %
Eosinophils Absolute: 0.2 10*3/uL (ref 0.0–0.5)
Eosinophils Relative: 3 %
HCT: 36.1 % (ref 36.0–46.0)
Hemoglobin: 11.8 g/dL — ABNORMAL LOW (ref 12.0–15.0)
Immature Granulocytes: 1 %
Lymphocytes Relative: 22 %
Lymphs Abs: 1.3 10*3/uL (ref 0.7–4.0)
MCH: 27.3 pg (ref 26.0–34.0)
MCHC: 32.7 g/dL (ref 30.0–36.0)
MCV: 83.6 fL (ref 80.0–100.0)
Monocytes Absolute: 0.4 10*3/uL (ref 0.1–1.0)
Monocytes Relative: 8 %
Neutro Abs: 3.9 10*3/uL (ref 1.7–7.7)
Neutrophils Relative %: 65 %
Platelets: 256 10*3/uL (ref 150–400)
RBC: 4.32 MIL/uL (ref 3.87–5.11)
RDW: 14.6 % (ref 11.5–15.5)
WBC: 5.9 10*3/uL (ref 4.0–10.5)
nRBC: 0 % (ref 0.0–0.2)

## 2022-09-10 LAB — VITAMIN D 25 HYDROXY (VIT D DEFICIENCY, FRACTURES): Vit D, 25-Hydroxy: 58.2 ng/mL (ref 30–100)

## 2022-09-10 NOTE — Telephone Encounter (Signed)
Patient called and stated that she is not inclined to have CT chest as scheduled due to financial reasons.  She will be obtaining new insurance coverage as of July 1 and would like to reschedule for scan at end of August.

## 2022-09-12 ENCOUNTER — Ambulatory Visit (HOSPITAL_COMMUNITY): Payer: BC Managed Care – PPO

## 2022-09-18 ENCOUNTER — Inpatient Hospital Stay: Payer: PRIVATE HEALTH INSURANCE | Admitting: Hematology

## 2022-10-08 ENCOUNTER — Encounter: Payer: Self-pay | Admitting: Gastroenterology

## 2022-10-08 ENCOUNTER — Ambulatory Visit (INDEPENDENT_AMBULATORY_CARE_PROVIDER_SITE_OTHER): Payer: BC Managed Care – PPO | Admitting: Gastroenterology

## 2022-10-08 VITALS — BP 134/80 | HR 73 | Temp 96.6°F | Ht 69.0 in | Wt 297.8 lb

## 2022-10-08 DIAGNOSIS — K219 Gastro-esophageal reflux disease without esophagitis: Secondary | ICD-10-CM | POA: Diagnosis not present

## 2022-10-08 DIAGNOSIS — K589 Irritable bowel syndrome without diarrhea: Secondary | ICD-10-CM

## 2022-10-08 NOTE — Patient Instructions (Signed)
You can decrease dicyclomine to once a day for the next week. If you tolerate this, then you can come off of it and see how you do.   Continue Nexium daily, 30 minutes before eating.  We can reach out to you around [facility] to arrange colonoscopy for December.   Please call with any further abdominal pain, nausea, vomiting, or any issues!  I enjoyed seeing you again today! I value our relationship and want to provide genuine, compassionate, and quality care. You may receive a survey regarding your visit with me, and I welcome your feedback! Thanks so much for taking the time to complete this. I look forward to seeing you again.      Gelene Mink, PhD, ANP-BC [facility]astroenter[facility]

## 2022-10-08 NOTE — Progress Notes (Addendum)
Gastroenterology Office Note     Primary Care Physician:  Lovey Newcomer, [name]  Primary Gastroenterologist: Dr. Levon Hedger    Chief Complaint   Chief Complaint  Patient presents with   Follow-up    Patient here for a follow up. Patient denies any current gi issues. Patient takes Otc Nexium, which does control her symptoms. She is concerned she may need to come off as she has heard the side effects of ppi's. Did mention TIF to her.     History of Present Illness   Tanya Galloway is a 57 y.o. female presenting today with history of chronic GERD and suspected IBS-D. Last seen in Jan 2024 at request of PCP due to episode of abdominal pain, N/V/D. She was treated empirically for diverticulitis prior to being seen by GI at that time but no imaging was completed. By the time she had presented to [place], her symptoms were quiescent.   Chronic GERD controlled on Nexium OTC. No dysphagia. No abdominal pain, N/V.  Insruance changing in January. Retired April 1st. Larey Seat in May 2024 and went to ED. Had CT scan. Known fatty liver. No acute or concerning findings on CT.Last A1c 8.0. Working on weight loss. Started jardiance to help with diabetes control. She is aware of healthy eating habits, weight loss, need for glycemic control. Most recent LFTs normal. She would like to wean off non-essential prescriptions if possible in future.   She would like to have colonoscopy around first of December.    Last colonoscopy Dec 2019 by Dr. Karilyn Cota: seven 4-6 mm polyps , sigmoid diverticulosis. 4 tubular adenomas 1 sessile serrated polyp and 2 are hyperplastic polyps   Past Medical History:  Diagnosis Date   Diabetes mellitus without complication (HCC)    Diverticulosis    Fatty liver    GERD (gastroesophageal reflux disease)    H/O seasonal allergies    Hemorrhoids    Hypertension    Irritable bowel syndrome    Lung cancer [facility]     Past Surgical History:  Procedure Laterality Date   COLONOSCOPY N/A  03/11/2018   seven 4-6 mm polyps , sigmoid diverticulosis. 4 tubular adenomas 1 sessile serrated polyp and 2 are hyperplastic polyps   KNEE ARTHROSCOPY  04/07/2009   left knee   LOBECTOMY Left 01/11/2019   Procedure: possible LOBECTOMY;  Surgeon: Corliss Skains, MD;  Location: [facility] OR;  Service: Thoracic;  Laterality: Left;   POLYPECTOMY  03/11/2018   Procedure: POLYPECTOMY;  Surgeon: Malissa Hippo, MD;  Location: [facility];  Service: Endoscopy;;  splenic flexure (CSx1), transverse colon (CBx1), hepatic flexure (CSx1), descending colon (CSx1)distal sigmoid colon(CSx3)   VIDEO ASSISTED THORACOSCOPY (VATS)/WEDGE RESECTION Left 01/11/2019   Procedure: VIDEO ASSISTED THORACOSCOPY (VATS)/LEFT UPPER LOBE WEDGE RESECTION;  Surgeon: Corliss Skains, MD;  Location: [facility] OR;  Service: Thoracic;  Laterality: Left;   VIDEO BRONCHOSCOPY N/A 01/11/2019   Procedure: VIDEO BRONCHOSCOPY;  Surgeon: Corliss Skains, MD;  Location: [facility] OR;  Service: Thoracic;  Laterality: N/A;    Current Outpatient Medications  Medication Sig Dispense Refill   acetaminophen (TYLENOL) 500 MG tablet Take 2 tablets (1,000 mg total) by mouth every 6 (six) hours. (Patient taking differently: Take 500-1,000 mg by mouth every 8 (eight) hours as needed for mild pain, moderate pain or headache.) 30 tablet 0   B Complex-C (SUPER B COMPLEX PO) Take by mouth daily at 6 (six) AM.     calcipotriene-betamethasone (TACLONEX SCALP) external suspension Apply 6  drops topically daily.     Cholecalciferol (VITAMIN D3) 50 MCG (2000 UT) TABS Take 1 tablet by mouth in the morning and at bedtime.     clobetasol (TEMOVATE) 0.05 % external solution Apply 1 application  topically 2 (two) times daily as needed (psorasis).     Cyanocobalamin (VITAMIN B-12 CR PO) Take by mouth daily at 6 (six) AM.     dicyclomine (BENTYL) 10 MG capsule Take 1 capsule (10 mg total) by mouth 2 (two) times daily before a meal. 180 capsule 3   empagliflozin  (JARDIANCE) 10 MG TABS tablet Take 10 mg by mouth daily.     esomeprazole (NEXIUM) 40 MG capsule Take 40 mg by mouth daily.     ketoconazole (NIZORAL) 2 % shampoo Apply 1 application topically 2 (two) times a week.     Lancets (ONETOUCH DELICA PLUS LANCET30G) MISC USE TO TEST FASTING BLOOD SUGAR D     lisinopril-hydrochlorothiazide (ZESTORETIC) 20-12.5 MG tablet Take 1 tablet by mouth daily.     metFORMIN (GLUCOPHAGE-XR) 500 MG 24 hr tablet Take 1,000 mg by mouth 2 (two) times daily with a meal.  2   Pediatric Multiple Vitamins (FLINSTONES GUMMIES OMEGA-3 DHA PO) Take by mouth daily at 6 (six) AM.     No current facility-administered medications for this visit.    Allergies as of 10/08/2022 - Review Complete 10/08/2022  Allergen Reaction Noted   Sulfa antibiotics Nausea Only and Swelling 03/08/2018    Family History  Problem Relation Age of Onset   Diabetes Mother    Thyroid disease Mother    Hypertension Mother    Hypertension Father    Hypertension Brother    Healthy Brother    Breast cancer Maternal Grandmother    Colon cancer Neg Hx     Social History   Socioeconomic History   Marital status: Married    Spouse name: Tanya Galloway.   Number of children: Not on file   Years of education: Not on file   Highest education level: Not on file  Occupational History   Not on file  Tobacco Use   Smoking status: Former    Types: Cigarettes    Quit date: 09/15/2009    Years since quitting: 13.0   Smokeless tobacco: Never   Tobacco comments:    Patient smoked for 25 years, 1 1/2 ppd  Vaping Use   Vaping Use: Never used  Substance and Sexual Activity   Alcohol use: Yes    Comment: Twice a Year   Drug use: No   Sexual activity: Not on file  Other Topics Concern   Not on file  Social History Narrative   Not on file   Social Determinants of Health   Financial Resource Strain: Low Risk  (11/24/2018)   Overall Financial Resource Strain (CARDIA)    Difficulty of Paying  Living Expenses: Not hard at all  Food Insecurity: No Food Insecurity (11/24/2018)   Hunger Vital Sign    Worried About Running Out of Food in the Last Year: Never true    Ran Out of Food in the Last Year: Never true  Transportation Needs: No Transportation Needs (11/24/2018)   PRAPARE - Administrator, Civil Service (Medical): No    Lack of Transportation (Non-Medical): No  Physical Activity: Insufficiently Active (11/24/2018)   Exercise Vital Sign    Days of Exercise per Week: 1 day    Minutes of Exercise per Session: 10 min  Stress: No Stress  Concern Present (11/24/2018)   [employer] of Occupational Health - Occupational Stress Questionnaire    Feeling of Stress : Only a little  Social Connections: Moderately Integrated (11/24/2018)   Social Connection and Isolation Panel [NHANES]    Frequency of Communication with Friends and Family: More than three times a week    Frequency of Social Gatherings with Friends and Family: More than three times a week    Attends Religious Services: More than 4 times per year    Active Member of [employer] or Organizations: No    Attends Banker Meetings: Never    Marital Status: Married  Catering manager Violence: Not At Risk (11/24/2018)   Humiliation, Afraid, Rape, and Kick questionnaire    Fear of Current or Ex-Partner: No    Emotionally Abused: No    Physically Abused: No    Sexually Abused: No     Review of Systems   Gen: Denies any fever, chills, fatigue, weight loss, lack of appetite.  CV: Denies chest pain, heart palpitations, peripheral edema, syncope.  Resp: Denies shortness of breath at rest or with exertion. Denies wheezing or cough.  GI: Denies dysphagia or odynophagia. Denies jaundice, hematemesis, fecal incontinence. GU : Denies urinary burning, urinary frequency, urinary hesitancy MS: Denies joint pain, muscle weakness, cramps, or limitation of movement.  Derm: Denies rash, itching, dry skin Psych: Denies  depression, anxiety, memory loss, and confusion Heme: Denies bruising, bleeding, and enlarged lymph nodes.   Physical Exam   BP 134/80 (BP Location: Left Arm, Patient Position: Sitting, Cuff Size: Large)   Pulse 73   Temp (!) 96.6 F (35.9 C) (Temporal)   Ht 5\' 9"  (1.753 m)   Wt 297 lb 12.8 oz (135.1 kg)   LMP 05/09/2018   BMI 43.98 kg/m  General:   Alert and oriented. Pleasant and cooperative. Well-nourished and well-developed.  Head:  Normocephalic and atraumatic. Eyes:  Without icterus Abdomen:  +BS, soft, non-tender and non-distended. No HSM noted. No guarding or rebound. No masses appreciated.  Rectal:  Deferred  Msk:  Symmetrical without gross deformities. Normal posture. Extremities:  Without edema. Neurologic:  Alert and  oriented x4;  grossly normal neurologically. Skin:  Intact without significant lesions or rashes. Psych:  Alert and cooperative. Normal mood and affect.   Assessment   Tanya Galloway is a 57 y.o. female with history of chronic GERD and suspected IBS-D. Last seen in Jan 2024 at request of PCP due to episode of abdominal pain, N/V/D. She was treated empirically for diverticulitis prior to being seen by GI at that time but no imaging was completed. By the time she had presented to [place], her symptoms were quiescent.   She has had no further abdominal pain and doing well from this respect. She has chronically been on dicyclomine BID; we discussed weaning off of this to see how she does, as I suspect she may not need this going forward.   Chronic GERD controlled on Nexium OTC. No alarm signs/symptoms.   MASLD : known history of hepatic steatosis. LFTs normal. No splenomegaly or thrombocytopenia. Discussed glycemic control, lipid management, weight loss, avoidance of alcohol, and dietary changes.   Due for surveillance colonoscopy this December. I have asked scheduling to nic for Oct/Nov to contact patient to triage.    PLAN    Continue Nexium daily Attempt  weaning off dicyclomine Avoid alcohol Call if any recurrent abdominal pain Colonoscopy in Dec 2024 due to history of polyps. Will triage closer to  that time 1 year follow-up   Gelene Mink, PhD, ANP-BC [facility]enter[facility]   I have reviewed the note and agree with the APP's assessment as described in this progress note  Katrinka Blazing, MD Gastroenterology and Hepatology [facility] Gastroenterology

## 2022-11-27 ENCOUNTER — Other Ambulatory Visit: Payer: Self-pay

## 2022-11-27 DIAGNOSIS — Z87891 Personal history of nicotine dependence: Secondary | ICD-10-CM

## 2022-11-27 DIAGNOSIS — Z122 Encounter for screening for malignant neoplasm of respiratory organs: Secondary | ICD-10-CM

## 2022-12-03 ENCOUNTER — Ambulatory Visit (HOSPITAL_COMMUNITY)
Admission: RE | Admit: 2022-12-03 | Discharge: 2022-12-03 | Disposition: A | Discharge status: Home / Self Care | Payer: BC Managed Care – PPO | Source: Ambulatory Visit | Attending: Hematology | Admitting: Hematology

## 2022-12-03 ENCOUNTER — Ambulatory Visit (HOSPITAL_COMMUNITY): Payer: BC Managed Care – PPO

## 2022-12-03 DIAGNOSIS — Z87891 Personal history of nicotine dependence: Secondary | ICD-10-CM | POA: Insufficient documentation

## 2022-12-03 DIAGNOSIS — Z122 Encounter for screening for malignant neoplasm of respiratory organs: Secondary | ICD-10-CM | POA: Insufficient documentation

## 2022-12-09 ENCOUNTER — Inpatient Hospital Stay: Payer: BC Managed Care – PPO | Attending: Hematology | Admitting: Hematology

## 2022-12-09 VITALS — BP 107/58 | HR 92 | Temp 97.8°F | Resp 18 | Ht 69.0 in | Wt 292.6 lb

## 2022-12-09 DIAGNOSIS — R911 Solitary pulmonary nodule: Secondary | ICD-10-CM | POA: Diagnosis present

## 2022-12-09 DIAGNOSIS — Z122 Encounter for screening for malignant neoplasm of respiratory organs: Secondary | ICD-10-CM | POA: Diagnosis not present

## 2022-12-09 DIAGNOSIS — Z87891 Personal history of nicotine dependence: Secondary | ICD-10-CM | POA: Insufficient documentation

## 2022-12-09 DIAGNOSIS — E559 Vitamin D deficiency, unspecified: Secondary | ICD-10-CM | POA: Diagnosis not present

## 2022-12-09 NOTE — Patient Instructions (Addendum)
[facility]  Discharge Instructions  You were seen and examined today by Dr. Ellin Saba.  Dr. Ellin Saba discussed your most recent lab work and CT scan which revealed that everything looks good.  Follow-up as scheduled in 1 year.    Thank you for choosing  [facility] to provide your oncology and hematology care.   To afford each patient quality time with our provider, please arrive at least 15 minutes before your scheduled appointment time. You may need to reschedule your appointment if you arrive late (10 or more minutes). Arriving late affects you and other patients whose appointments are after yours.  Also, if you miss three or more appointments without notifying the office, you may be dismissed from the clinic at the provider's discretion.    Again, thank you for choosing [facility].  Our hope is that these requests will decrease the amount of time that you wait before being seen by our physicians.   If you have a lab appointment with the Cancer Center - please note that after April 8th, all labs will be drawn in the cancer center.  You do not have to check in or register with the main entrance as you have in the past but will complete your check-in at the cancer center.            _____________________________________________________________  Should you have questions after your visit to [facility], please contact our office at [phone] and follow the prompts.  Our office hours are 8:00 a.m. to 4:30 p.m. Monday - Thursday and 8:00 a.m. to 2:30 p.m. Friday.  Please note that voicemails left after 4:00 p.m. may not be returned until the following business day.  We are closed weekends and all major holidays.  You do have access to a nurse 24-7, just call the main number to the clinic [phone] and do not press any options, hold on the line and a nurse will answer the phone.    For prescription refill  requests, have your pharmacy contact our office and allow 72 hours.    Masks are no longer required in the cancer centers. If you would like for your care team to wear a mask while they are taking care of you, please let them know. You may have one support person who is at least 55 years old accompany you for your appointments.

## 2022-12-09 NOTE — Progress Notes (Signed)
Tanya Galloway [address], [place] [address]    Clinic Day:  12/09/2022  Referring physician: Lovey Newcomer, PA  Patient Care Team: Tanya Newcomer, PA as PCP - General (Physician Assistant)   ASSESSMENT & PLAN:   Assessment: 1.  Left upper lobe lung nodule: -Patient felt pain in the left chest wall, went to the ER on 11/10/2018, chest x-ray showed abnormal findings. -PET scan on 11/23/2018 showed left upper lobe lung nodule measuring 1.7 cm with SUV of 4.2.  Single focus of metabolic activity within the mid vertebral body at T12 level.  SUV 5.6. -Quit smoking 9 years ago.  Smoked 1 to 2 packs a day for 20 years. -MRI of the T-spine with contrast on 12/14/2018 did not show any evidence of metastatic disease.  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 -MRI of the brain on 12/14/2018 did not show any evidence of metastatic disease. - Wedge resection of LUL nodule by Dr. Cliffton Galloway:  benign lung parenchyma with dense inflammation including microabscess.  No evidence of malignancy.   2.  Smoking history: - Quit smoking 13 years ago.  Smoked 2 packs/day for 10 years and 1 pack/day for 10 years.  Plan: 1.  Left upper lobe lung nodule: - She denies any infections in the last 1 year. - I have reviewed images of the CT lung cancer screening scan from 12/03/2022.  Result of it is pending.  No clear evidence of recurrence. - Recommend follow-up in 1 year with repeat low-dose CT scan.   2.  Vitamin D deficiency: - Continue vitamin D supplements.  Vitamin D level is 58.  Orders Placed This Encounter  Procedures  . CT CHEST LUNG CA SCREEN LOW DOSE W/O CM    Standing Status:   Future    Standing Expiration Date:   12/09/2023    Order Specific Question:   Preferred Imaging Location?    Answer:   [facility]    Order Specific Question:   Release to patient    Answer:   Immediate [1]    Order Specific Question:   Is patient pregnant?     Answer:   No      I,Tanya Galloway,acting as a scribe for Tanya Massed, MD.,have documented all relevant documentation on the behalf of Tanya Massed, MD,as directed by  Tanya Massed, MD while in the presence of Tanya Massed, MD.   I, Tanya Massed MD, have reviewed the above documentation for accuracy and completeness, and I agree with the above.   Tanya Massed, MD   9/3/20245:01 PM  CHIEF COMPLAINT:   Diagnosis: Nodule of left lung and Vitamin D deficiency   Cancer Staging  No matching staging information was found for the patient.    Prior Therapy:  VATS with left upper lobe resection on 01/11/2019   Current Therapy:  surveillance   HISTORY OF PRESENT ILLNESS:   Oncology History   No history exists.     INTERVAL HISTORY:   Tanya Galloway is a 57 y.o. female presenting to clinic today for follow up of left upper lobe lung nodule. She was last seen by me on 08/19/21.  Since her last visit, she underwent a CT lung cancer screening on 12/03/22 with results pending.  She presented to the ED on 08/28/22 for a fall.   Today, she states that she is doing well overall. Her appetite level is at 100%. Her energy level is at  80%.  She denies any lung infections or COVID. She reports lightheadedness upon exertion. She started Jardiance 2 months ago.    She quit smoking 13 years ago with 2 ppd for 10 years and 1 ppd for 10 years prior to that. She did not have a cancerous lesion on the left lung. Tanya Galloway is her PCP, though she is typically seen by his PA Tanya Galloway.   PAST MEDICAL HISTORY:   Past Medical History: Past Medical History:  Diagnosis Date  . Diabetes mellitus without complication (HCC)   . Diverticulosis   . Fatty liver   . GERD (gastroesophageal reflux disease)   . H/O seasonal allergies   . Hemorrhoids   . Hypertension   . Irritable bowel syndrome   . Lung cancer [facility])     Surgical History: Past Surgical History:   Procedure Laterality Date  . COLONOSCOPY N/A 03/11/2018   seven 4-6 mm polyps , sigmoid diverticulosis. 4 tubular adenomas 1 sessile serrated polyp and 2 are hyperplastic polyps  . KNEE ARTHROSCOPY  04/07/2009   left knee  . LOBECTOMY Left 01/11/2019   Procedure: possible LOBECTOMY;  Surgeon: Tanya Skains, MD;  Location: [facility] OR;  Service: Thoracic;  Laterality: Left;  . POLYPECTOMY  03/11/2018   Procedure: POLYPECTOMY;  Surgeon: Tanya Hippo, MD;  Location: [facility] END[facility]ITE;  Service: Endoscopy;;  splenic flexure (CSx1), transverse colon (CBx1), hepatic flexure (CSx1), descending colon (CSx1)distal sigmoid colon(CSx3)  . VIDEO ASSISTED THORACOSCOPY (VATS)/WEDGE RESECTION Left 01/11/2019   Procedure: VIDEO ASSISTED THORACOSCOPY (VATS)/LEFT UPPER LOBE WEDGE RESECTION;  Surgeon: Tanya Skains, MD;  Location: [facility] OR;  Service: Thoracic;  Laterality: Left;  Marland Kitchen VIDEO BRONCHOSCOPY N/A 01/11/2019   Procedure: VIDEO BRONCHOSCOPY;  Surgeon: Tanya Skains, MD;  Location: [facility] OR;  Service: Thoracic;  Laterality: N/A;    Social History: Social History   Socioeconomic History  . Marital status: Married    Spouse name: Tanya Galloway.  . Number of children: Not on file  . Years of education: Not on file  . Highest education level: Not on file  Occupational History  . Not on file  Tobacco Use  . Smoking status: Former    Current packs/day: 0.00    Types: Cigarettes    Quit date: 09/15/2009    Years since quitting: 13.2  . Smokeless tobacco: Never  . Tobacco comments:    Patient smoked for 25 years, 1 1/2 ppd  Vaping Use  . Vaping status: Never Used  Substance and Sexual Activity  . Alcohol use: Yes    Comment: Twice a Year  . Drug use: No  . Sexual activity: Not on file  Other Topics Concern  . Not on file  Social History Narrative  . Not on file   Social Determinants of Health   Financial Resource Strain: Low Risk  (11/24/2018)   Overall Financial Resource Strain  (CARDIA)   . Difficulty of Paying Living Expenses: Not hard at all  Food Insecurity: No Food Insecurity (11/24/2018)   Hunger Vital Sign   . Worried About Programme researcher, broadcasting/film/video in the Last Year: Never true   . Ran Out of Food in the Last Year: Never true  Transportation Needs: No Transportation Needs (11/24/2018)   PRAPARE - Transportation   . Lack of Transportation (Medical): No   . Lack of Transportation (Non-Medical): No  Physical Activity: Insufficiently Active (11/24/2018)   Exercise Vital Sign   . Days of Exercise per Week: 1 day   .  Minutes of Exercise per Session: 10 min  Stress: No Stress Concern Present (11/24/2018)   [employer] of Occupational Health - Occupational Stress Questionnaire   . Feeling of Stress : Only a little  Social Connections: Moderately Integrated (11/24/2018)   Social Connection and Isolation Panel [NHANES]   . Frequency of Communication with Friends and Family: More than three times a week   . Frequency of Social Gatherings with Friends and Family: More than three times a week   . Attends Religious Services: More than 4 times per year   . Active Member of Clubs or Organizations: No   . Attends Banker Meetings: Never   . Marital Status: Married  Catering manager Violence: Not At Risk (11/30/2020)   [facility]   Humiliation, Afraid, Rape, and Kick questionnaire   . Fear of Current or Ex-Partner: No   . Emotionally Abused: No   . Physically Abused: No   . Sexually Abused: No    Family History: Family History  Problem Relation Age of Onset  . Diabetes Mother   . Thyroid disease Mother   . Hypertension Mother   . Hypertension Father   . Hypertension Brother   . Healthy Brother   . Breast cancer Maternal Grandmother   . Colon cancer Neg Hx     Current Medications:  Current Outpatient Medications:  .  acetaminophen (TYLENOL) 500 MG tablet, Take 2 tablets (1,000 mg total) by mouth every 6 (six)  hours. (Patient taking differently: Take 500-1,000 mg by mouth every 8 (eight) hours as needed for mild pain, moderate pain or headache.), Disp: 30 tablet, Rfl: 0 .  B Complex-C (SUPER B COMPLEX PO), Take by mouth daily at 6 (six) AM., Disp: , Rfl:  .  calcipotriene-betamethasone (TACLONEX SCALP) external suspension, Apply 6 drops topically daily., Disp: , Rfl:  .  Cholecalciferol (VITAMIN D3) 50 MCG (2000 UT) TABS, Take 1 tablet by mouth in the morning and at bedtime., Disp: , Rfl:  .  clobetasol (TEMOVATE) 0.05 % external solution, Apply 1 application  topically 2 (two) times daily as needed (psorasis)., Disp: , Rfl:  .  Cyanocobalamin (VITAMIN B-12 CR PO), Take by mouth daily at 6 (six) AM., Disp: , Rfl:  .  dicyclomine (BENTYL) 10 MG capsule, Take 1 capsule (10 mg total) by mouth 2 (two) times daily before a meal., Disp: 180 capsule, Rfl: 3 .  empagliflozin (JARDIANCE) 10 MG TABS tablet, Take 10 mg by mouth daily., Disp: , Rfl:  .  esomeprazole (NEXIUM) 40 MG capsule, Take 40 mg by mouth daily., Disp: , Rfl:  .  ketoconazole (NIZORAL) 2 % shampoo, Apply 1 application topically 2 (two) times a week., Disp: , Rfl:  .  Lancets (ONETOUCH DELICA PLUS LANCET30G) MISC, USE TO TEST FASTING BLOOD SUGAR D, Disp: , Rfl:  .  lisinopril-hydrochlorothiazide (ZESTORETIC) 20-12.5 MG tablet, Take 1 tablet by mouth daily., Disp: , Rfl:  .  metFORMIN (GLUCOPHAGE-XR) 500 MG 24 hr tablet, Take 1,000 mg by mouth 2 (two) times daily with a meal., Disp: , Rfl: 2 .  Pediatric Multiple Vitamins (FLINSTONES GUMMIES OMEGA-3 DHA PO), Take by mouth daily at 6 (six) AM., Disp: , Rfl:    Allergies: Allergies  Allergen Reactions  . Sulfa Antibiotics Nausea Only and Swelling    SWELLING REACTION UNSPECIFIED     REVIEW OF SYSTEMS:   Review of Systems  Constitutional:  Negative for chills, fatigue and fever.  HENT:  Negative for lump/mass, mouth sores, nosebleeds, sore throat and trouble swallowing.   Eyes:  Negative  for eye problems.  Respiratory:  Negative for cough and shortness of breath.   Cardiovascular:  Negative for chest pain, leg swelling and palpitations.  Gastrointestinal:  Positive for constipation and diarrhea. Negative for abdominal pain, nausea and vomiting.  Genitourinary:  Negative for bladder incontinence, difficulty urinating, dysuria, frequency, hematuria and nocturia.   Musculoskeletal:  Negative for arthralgias, back pain, flank pain, myalgias and neck pain.  Skin:  Negative for itching and rash.  Neurological:  Positive for dizziness. Negative for headaches and numbness.  Hematological:  Does not bruise/bleed easily.  Psychiatric/Behavioral:  Positive for sleep disturbance. Negative for depression and suicidal ideas. The patient is not nervous/anxious.   All other systems reviewed and are negative.    VITALS:   Blood pressure (!) 107/58, pulse 92, temperature 97.8 F (36.6 C), temperature source Oral, resp. rate 18, height 5\' 9"  (1.753 m), weight 292 lb 9.6 oz (132.7 kg), last menstrual period 05/09/2018, SpO2 95%.  Wt Readings from Last 3 Encounters:  12/09/22 292 lb 9.6 oz (132.7 kg)  10/08/22 297 lb 12.8 oz (135.1 kg)  08/28/22 300 lb (136.1 kg)    Body mass index is 43.21 kg/m.  Performance status (ECOG): {CHL ONC [id number]  PHYSICAL EXAM:   Physical Exam Vitals and nursing note reviewed. Exam conducted with a chaperone present.  Constitutional:      Appearance: Normal appearance.  Cardiovascular:     Rate and Rhythm: Normal rate and regular rhythm.     Pulses: Normal pulses.     Heart sounds: Normal heart sounds.  Pulmonary:     Effort: Pulmonary effort is normal.     Breath sounds: Normal breath sounds.  Abdominal:     Palpations: Abdomen is soft. There is no hepatomegaly, splenomegaly or mass.     Tenderness: There is no abdominal tenderness.  Musculoskeletal:     Right lower leg: No edema.     Left lower leg: No edema.  Lymphadenopathy:      Cervical: No cervical adenopathy.     Right cervical: No superficial, deep or posterior cervical adenopathy.    Left cervical: No superficial, deep or posterior cervical adenopathy.     Upper Body:     Right upper body: No supraclavicular or axillary adenopathy.     Left upper body: No supraclavicular or axillary adenopathy.  Neurological:     General: No focal deficit present.     Mental Status: She is alert and oriented to person, place, and time.  Psychiatric:        Mood and Affect: Mood normal.        Behavior: Behavior normal.    LABS:      Latest Ref Rng & Units 09/10/2022   11:07 AM 08/28/2022    7:45 PM 02/01/2022    9:45 AM  CBC  WBC 4.0 - 10.5 K/uL 5.9   10.5   Hemoglobin 12.0 - 15.0 g/dL 96.0  45.4  09.8   Hematocrit 36.0 - 46.0 % 36.1  35.0  39.3   Platelets 150 - 400 K/uL 256   213       Latest Ref Rng & Units 09/10/2022   11:07 AM 08/28/2022    7:45 PM 02/01/2022    9:45 AM  CMP  Glucose 70 - 99 mg/dL 119  147  829   BUN 6 - 20 mg/dL 21  23  16  Creatinine 0.44 - 1.00 mg/dL 2.44  0.10  2.72   Sodium 135 - 145 mmol/L 134  139  135   Potassium 3.5 - 5.1 mmol/L 4.3  3.9  4.4   Chloride 98 - 111 mmol/L 99  99  100   CO2 22 - 32 mmol/L 24   27   Calcium 8.9 - 10.3 mg/dL 9.0   8.5   Total Protein 6.5 - 8.1 g/dL 6.5     Total Bilirubin 0.3 - 1.2 mg/dL 0.6     Alkaline Phos 38 - 126 U/L 100     AST 15 - 41 U/L 31     ALT 0 - 44 U/L 41        No results found for: "CEA1", "CEA" / No results found for: "CEA1", "CEA" No results found for: "PSA1" No results found for: "ZDG644" No results found for: "CAN125"  No results found for: "TOTALPROTELP", "ALBUMINELP", "A1GS", "A2GS", "BETS", "BETA2SER", "GAMS", "MSPIKE", "SPEI" No results found for: "TIBC", "FERRITIN", "IRONPCTSAT" Lab Results  Component Value Date   LDH 114 04/25/2020     STUDIES:   No results found.

## 2023-01-26 ENCOUNTER — Other Ambulatory Visit: Payer: Self-pay | Admitting: Family Medicine

## 2023-01-26 DIAGNOSIS — Z1231 Encounter for screening mammogram for malignant neoplasm of breast: Secondary | ICD-10-CM

## 2023-01-30 ENCOUNTER — Encounter (INDEPENDENT_AMBULATORY_CARE_PROVIDER_SITE_OTHER): Payer: Self-pay | Admitting: *Deleted

## 2023-02-19 ENCOUNTER — Ambulatory Visit: Payer: BC Managed Care – PPO

## 2023-03-18 ENCOUNTER — Ambulatory Visit
Admission: RE | Admit: 2023-03-18 | Discharge: 2023-03-18 | Disposition: A | Discharge status: Home / Self Care | Payer: BC Managed Care – PPO | Source: Ambulatory Visit | Attending: Family Medicine | Admitting: Family Medicine

## 2023-03-18 DIAGNOSIS — Z1231 Encounter for screening mammogram for malignant neoplasm of breast: Secondary | ICD-10-CM

## 2023-09-28 ENCOUNTER — Encounter: Payer: Self-pay | Admitting: Gastroenterology

## 2023-12-03 ENCOUNTER — Other Ambulatory Visit: Payer: Self-pay

## 2023-12-03 DIAGNOSIS — Z122 Encounter for screening for malignant neoplasm of respiratory organs: Secondary | ICD-10-CM

## 2023-12-03 DIAGNOSIS — E559 Vitamin D deficiency, unspecified: Secondary | ICD-10-CM

## 2023-12-03 DIAGNOSIS — R911 Solitary pulmonary nodule: Secondary | ICD-10-CM

## 2023-12-03 DIAGNOSIS — Z87891 Personal history of nicotine dependence: Secondary | ICD-10-CM

## 2023-12-04 ENCOUNTER — Ambulatory Visit (HOSPITAL_COMMUNITY)
Admission: RE | Admit: 2023-12-04 | Discharge: 2023-12-04 | Disposition: A | Discharge status: Home / Self Care | Payer: BC Managed Care – PPO | Source: Ambulatory Visit | Attending: Hematology | Admitting: Hematology

## 2023-12-04 ENCOUNTER — Inpatient Hospital Stay: Payer: BC Managed Care – PPO

## 2023-12-04 DIAGNOSIS — R911 Solitary pulmonary nodule: Secondary | ICD-10-CM | POA: Insufficient documentation

## 2023-12-04 DIAGNOSIS — Z122 Encounter for screening for malignant neoplasm of respiratory organs: Secondary | ICD-10-CM | POA: Diagnosis present

## 2023-12-04 DIAGNOSIS — E559 Vitamin D deficiency, unspecified: Secondary | ICD-10-CM

## 2023-12-04 DIAGNOSIS — Z87891 Personal history of nicotine dependence: Secondary | ICD-10-CM | POA: Insufficient documentation

## 2023-12-04 LAB — COMPREHENSIVE METABOLIC PANEL WITH GFR
ALT: 21 U/L (ref 0–44)
AST: 18 U/L (ref 15–41)
Albumin: 3.3 g/dL — ABNORMAL LOW (ref 3.5–5.0)
Alkaline Phosphatase: 81 U/L (ref 38–126)
Anion gap: 10 (ref 5–15)
BUN: 19 mg/dL (ref 6–20)
CO2: 25 mmol/L (ref 22–32)
Calcium: 8.7 mg/dL — ABNORMAL LOW (ref 8.9–10.3)
Chloride: 105 mmol/L (ref 98–111)
Creatinine, Ser: 1.17 mg/dL — ABNORMAL HIGH (ref 0.44–1.00)
GFR, Estimated: 55 mL/min — ABNORMAL LOW (ref >60)
Glucose, Bld: 168 mg/dL — ABNORMAL HIGH (ref 70–99)
Potassium: 4.3 mmol/L (ref 3.5–5.1)
Sodium: 140 mmol/L (ref 135–145)
Total Bilirubin: 0.7 mg/dL (ref 0.0–1.2)
Total Protein: 6.1 g/dL — ABNORMAL LOW (ref 6.5–8.1)

## 2023-12-04 LAB — CBC WITH DIFFERENTIAL/PLATELET
Abs Immature Granulocytes: 0.02 K/uL (ref 0.00–0.07)
Basophils Absolute: 0.1 K/uL (ref 0.0–0.1)
Basophils Relative: 2 %
Eosinophils Absolute: 0.3 K/uL (ref 0.0–0.5)
Eosinophils Relative: 5 %
HCT: 41.9 % (ref 36.0–46.0)
Hemoglobin: 13 g/dL (ref 12.0–15.0)
Immature Granulocytes: 0 %
Lymphocytes Relative: 18 %
Lymphs Abs: 1.1 K/uL (ref 0.7–4.0)
MCH: 25.2 pg — ABNORMAL LOW (ref 26.0–34.0)
MCHC: 31 g/dL (ref 30.0–36.0)
MCV: 81.2 fL (ref 80.0–100.0)
Monocytes Absolute: 0.5 K/uL (ref 0.1–1.0)
Monocytes Relative: 7 %
Neutro Abs: 4.2 K/uL (ref 1.7–7.7)
Neutrophils Relative %: 68 %
Platelets: 222 K/uL (ref 150–400)
RBC: 5.16 MIL/uL — ABNORMAL HIGH (ref 3.87–5.11)
RDW: 15.3 % (ref 11.5–15.5)
WBC: 6.1 K/uL (ref 4.0–10.5)
nRBC: 0 % (ref 0.0–0.2)

## 2023-12-04 LAB — VITAMIN D 25 HYDROXY (VIT D DEFICIENCY, FRACTURES): Vit D, 25-Hydroxy: 47.43 ng/mL (ref 30–100)

## 2023-12-10 ENCOUNTER — Inpatient Hospital Stay: Payer: BC Managed Care – PPO | Attending: Oncology | Admitting: Oncology

## 2023-12-10 VITALS — BP 143/83 | HR 91 | Temp 98.7°F | Resp 20 | Wt 285.1 lb

## 2023-12-10 DIAGNOSIS — N189 Chronic kidney disease, unspecified: Secondary | ICD-10-CM | POA: Diagnosis not present

## 2023-12-10 DIAGNOSIS — R911 Solitary pulmonary nodule: Secondary | ICD-10-CM | POA: Diagnosis not present

## 2023-12-10 DIAGNOSIS — E559 Vitamin D deficiency, unspecified: Secondary | ICD-10-CM | POA: Insufficient documentation

## 2023-12-10 DIAGNOSIS — Z122 Encounter for screening for malignant neoplasm of respiratory organs: Secondary | ICD-10-CM

## 2023-12-10 DIAGNOSIS — Z87891 Personal history of nicotine dependence: Secondary | ICD-10-CM | POA: Diagnosis not present

## 2023-12-10 NOTE — Assessment & Plan Note (Addendum)
-   She denies any infections in the last 1 year. - I have reviewed images of the CT lung cancer screening scan from 12/04/2023.  Lung RADS 2 benign appearance or behavior.  Continue with annual low-dose CT scan in 12 months, emphysema. - Recommend follow-up in 1 year with repeat low-dose CT scan.

## 2023-12-10 NOTE — Progress Notes (Signed)
 [facility] OFFICE PROGRESS NOTE  Tanya Galloway, Tanya Galloway  ASSESSMENT & PLAN:    Assessment & Plan Lung nodule - She denies any infections in the last 1 year. - I have reviewed images of the CT lung cancer screening scan from 12/04/2023.  Lung RADS 2 benign appearance or behavior.  Continue with annual low-dose CT scan in 12 months, emphysema. - Recommend follow-up in 1 year with repeat low-dose CT scan. Vitamin D  deficiency - Continue vitamin D  supplements.  Vitamin D  level is 47.43. Screening for malignant neoplasm of respiratory organ -Screening per above. Chronic kidney disease, unspecified CKD stage Labs from 12/04/2023 show creatinine 1.17.  Over the past year, creatinine has been elevated. Continue to monitor.  May need to be followed by nephrology at some point.  Orders Placed This Encounter  Procedures   CT CHEST LUNG CA SCREEN LOW DOSE W/O CM    Standing Status:   Future    Expected Date:   12/09/2024    Expiration Date:   12/09/2024    Preferred Imaging Location?:   [facility]    Release to patient:   Immediate [1]    Is patient pregnant?:   No   CBC with Differential    Standing Status:   Future    Expected Date:   12/09/2024    Expiration Date:   03/09/2025   Comprehensive metabolic panel    Standing Status:   Future    Expected Date:   12/09/2024    Expiration Date:   03/09/2025   Vitamin D  25 hydroxy    Standing Status:   Future    Expected Date:   12/09/2024    Expiration Date:   03/09/2025    INTERVAL HISTORY: Patient returns for follow-up for history of lung nodule.  She recently had a low-dose CT scan on 12/04/2023 which was read as lung RADS 2 continue annual screening with low-dose CT scan in 12 months and emphysema.  Denies any interval hospitalizations, surgeries or changes to baseline health.  We reviewed LDCT lung cancer screening, CBC, CMP, vitamin D .  SUMMARY OF HEMATOLOGIC HISTORY: Oncology History   No history exists.    1.   Left upper lobe lung nodule: -Patient felt pain in the left chest wall, went to the ER on 11/10/2018, chest x-ray showed abnormal findings. -PET scan on 11/23/2018 showed left upper lobe lung nodule measuring 1.7 cm with SUV of 4.2.  Single focus of metabolic activity within the mid vertebral body at T12 level.  SUV 5.6. -Quit smoking 9 years ago.  Smoked 1 to 2 packs a day for 20 years. -MRI of the T-spine with contrast on 12/14/2018 did not show any evidence of metastatic disease.  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 -MRI of the brain on 12/14/2018 did not show any evidence of metastatic disease. - Wedge resection of LUL nodule by Dr. Shyrl:  benign lung parenchyma with dense inflammation including microabscess.  No evidence of malignancy.   2.  Smoking history: - Quit smoking 13 years ago.  Smoked 2 packs/day for 10 years and 1 pack/day for 10 years. CBC    Component Value Date/Time   WBC 6.1 12/04/2023 0920   RBC 5.16 (H) 12/04/2023 0920   HGB 13.0 12/04/2023 0920   HCT 41.9 12/04/2023 0920   PLT 222 12/04/2023 0920   MCV 81.2 12/04/2023 0920   MCH 25.2 (L) 12/04/2023 0920   MCHC  31.0 12/04/2023 0920   RDW 15.3 12/04/2023 0920   LYMPHSABS 1.1 12/04/2023 0920   MONOABS 0.5 12/04/2023 0920   EOSABS 0.3 12/04/2023 0920   BASOSABS 0.1 12/04/2023 0920       Latest Ref Rng & Units 12/04/2023    9:20 AM 09/10/2022   11:07 AM 08/28/2022    7:45 PM  CMP  Glucose 70 - 99 mg/dL 831  780  845   BUN 6 - 20 mg/dL 19  21  23    Creatinine 0.44 - 1.00 mg/dL 8.82  8.96  8.69   Sodium 135 - 145 mmol/L 140  134  139   Potassium 3.5 - 5.1 mmol/L 4.3  4.3  3.9   Chloride 98 - 111 mmol/L 105  99  99   CO2 22 - 32 mmol/L 25  24    Calcium 8.9 - 10.3 mg/dL 8.7  9.0    Total Protein 6.5 - 8.1 g/dL 6.1  6.5    Total Bilirubin 0.0 - 1.2 mg/dL 0.7  0.6    Alkaline Phos 38 - 126 U/L 81  100    AST 15 - 41 U/L 18  31    ALT 0 - 44 U/L 21  41       Lab Results   Component Value Date   VITAMINB12 330 04/25/2020    Vitals:   12/10/23 1002  BP: (!) 143/83  Pulse: 91  Resp: 20  Temp: 98.7 F (37.1 C)  SpO2: 97%    Review of System:  Review of Systems  Constitutional:  Positive for malaise/fatigue.  Gastrointestinal:  Positive for constipation and diarrhea.  Neurological:  Positive for tingling and sensory change.  Psychiatric/Behavioral:  The patient has insomnia.     Physical Exam: Physical Exam Constitutional:      Appearance: Normal appearance. She is obese.  HENT:     Head: Normocephalic and atraumatic.  Eyes:     Pupils: Pupils are equal, round, and reactive to light.  Cardiovascular:     Rate and Rhythm: Normal rate and regular rhythm.     Heart sounds: Normal heart sounds. No murmur heard. Pulmonary:     Effort: Pulmonary effort is normal.     Breath sounds: Normal breath sounds. No wheezing.  Abdominal:     General: Bowel sounds are normal. There is no distension.     Palpations: Abdomen is soft.     Tenderness: There is no abdominal tenderness.  Musculoskeletal:        General: Normal range of motion.     Cervical back: Normal range of motion.  Skin:    General: Skin is warm and dry.     Findings: No rash.  Neurological:     Mental Status: She is alert and oriented to person, place, and time.     Gait: Gait is intact.  Psychiatric:        Mood and Affect: Mood and affect normal.        Cognition and Memory: Memory normal.        Judgment: Judgment normal.      I spent 20 minutes dedicated to the care of this patient (face-to-face and non-face-to-face) on the date of the encounter to include what is described in the assessment and plan.,  Delon Hope, NP 12/10/2023 12:26 PM

## 2023-12-10 NOTE — Assessment & Plan Note (Addendum)
-   Continue vitamin D  supplements.  Vitamin D  level is 47.43.

## 2023-12-14 ENCOUNTER — Other Ambulatory Visit (HOSPITAL_BASED_OUTPATIENT_CLINIC_OR_DEPARTMENT_OTHER): Payer: Self-pay

## 2023-12-14 MED ORDER — JARDIANCE 10 MG PO TABS
10.0000 mg | ORAL_TABLET | Freq: Every day | ORAL | 3 refills | Status: DC
  Filled 2023-12-14: qty 30, 30d supply, fill #0
  Filled 2024-01-15: qty 30, 30d supply, fill #1
  Filled 2024-02-01 – 2024-02-08 (×2): qty 30, 30d supply, fill #2
  Filled 2024-03-11: qty 30, 30d supply, fill #3

## 2023-12-14 MED ORDER — METFORMIN HCL ER 500 MG PO TB24
1000.0000 mg | ORAL_TABLET | Freq: Two times a day (BID) | ORAL | 1 refills | Status: AC
  Filled 2024-02-22: qty 180, 90d supply, fill #0
  Filled 2024-02-25: qty 360, 90d supply, fill #0

## 2023-12-14 MED ORDER — CLOBETASOL PROPIONATE 0.05 % EX SOLN: 1.0000 | Freq: Two times a day (BID) | CUTANEOUS | 3 refills | Status: AC

## 2023-12-14 MED ORDER — KETOCONAZOLE 2 % EX SHAM
MEDICATED_SHAMPOO | CUTANEOUS | 3 refills | Status: AC
  Filled 2024-01-05: qty 120, 30d supply, fill #0
  Filled 2024-03-11: qty 120, 30d supply, fill #1

## 2023-12-14 MED ORDER — KETOCONAZOLE 2 % EX SHAM: MEDICATED_SHAMPOO | CUTANEOUS | 1 refills | Status: AC

## 2023-12-14 MED ORDER — GLUCOSE BLOOD VI STRP: ORAL_STRIP | 3 refills | Status: AC

## 2024-01-05 ENCOUNTER — Other Ambulatory Visit (HOSPITAL_BASED_OUTPATIENT_CLINIC_OR_DEPARTMENT_OTHER): Payer: Self-pay

## 2024-01-15 ENCOUNTER — Other Ambulatory Visit (HOSPITAL_BASED_OUTPATIENT_CLINIC_OR_DEPARTMENT_OTHER): Payer: Self-pay

## 2024-01-20 ENCOUNTER — Encounter (INDEPENDENT_AMBULATORY_CARE_PROVIDER_SITE_OTHER): Payer: Self-pay | Admitting: Gastroenterology

## 2024-02-01 ENCOUNTER — Other Ambulatory Visit (HOSPITAL_BASED_OUTPATIENT_CLINIC_OR_DEPARTMENT_OTHER): Payer: Self-pay

## 2024-02-01 ENCOUNTER — Other Ambulatory Visit (INDEPENDENT_AMBULATORY_CARE_PROVIDER_SITE_OTHER): Payer: Self-pay

## 2024-02-01 ENCOUNTER — Encounter (HOSPITAL_BASED_OUTPATIENT_CLINIC_OR_DEPARTMENT_OTHER): Payer: Self-pay

## 2024-02-01 MED ORDER — DICYCLOMINE HCL 10 MG PO CAPS
10.0000 mg | ORAL_CAPSULE | Freq: Two times a day (BID) | ORAL | 0 refills | Status: DC
  Filled 2024-02-01: qty 180, 90d supply, fill #0

## 2024-02-08 ENCOUNTER — Other Ambulatory Visit (HOSPITAL_BASED_OUTPATIENT_CLINIC_OR_DEPARTMENT_OTHER): Payer: Self-pay

## 2024-02-18 ENCOUNTER — Other Ambulatory Visit: Payer: Self-pay | Admitting: Physician Assistant

## 2024-02-18 DIAGNOSIS — Z1231 Encounter for screening mammogram for malignant neoplasm of breast: Secondary | ICD-10-CM

## 2024-02-22 ENCOUNTER — Other Ambulatory Visit (HOSPITAL_BASED_OUTPATIENT_CLINIC_OR_DEPARTMENT_OTHER): Payer: Self-pay

## 2024-02-25 ENCOUNTER — Other Ambulatory Visit (HOSPITAL_BASED_OUTPATIENT_CLINIC_OR_DEPARTMENT_OTHER): Payer: Self-pay

## 2024-03-11 ENCOUNTER — Other Ambulatory Visit (HOSPITAL_BASED_OUTPATIENT_CLINIC_OR_DEPARTMENT_OTHER): Payer: Self-pay

## 2024-03-22 ENCOUNTER — Ambulatory Visit
Admission: RE | Admit: 2024-03-22 | Discharge: 2024-03-22 | Disposition: A | Source: Ambulatory Visit | Attending: Physician Assistant | Admitting: Physician Assistant

## 2024-03-22 DIAGNOSIS — Z1231 Encounter for screening mammogram for malignant neoplasm of breast: Secondary | ICD-10-CM

## 2024-04-11 ENCOUNTER — Other Ambulatory Visit (HOSPITAL_BASED_OUTPATIENT_CLINIC_OR_DEPARTMENT_OTHER): Payer: Self-pay

## 2024-04-11 MED ORDER — CEFUROXIME AXETIL 250 MG PO TABS
250.0000 mg | ORAL_TABLET | Freq: Two times a day (BID) | ORAL | 0 refills | Status: AC
  Filled 2024-04-11: qty 20, 10d supply, fill #0

## 2024-04-12 ENCOUNTER — Other Ambulatory Visit (HOSPITAL_BASED_OUTPATIENT_CLINIC_OR_DEPARTMENT_OTHER): Payer: Self-pay

## 2024-04-12 MED ORDER — EMPAGLIFLOZIN 10 MG PO TABS
10.0000 mg | ORAL_TABLET | Freq: Every day | ORAL | 3 refills | Status: AC
  Filled 2024-04-12: qty 30, 30d supply, fill #0
  Filled 2024-05-11: qty 30, 30d supply, fill #1

## 2024-04-13 ENCOUNTER — Other Ambulatory Visit (HOSPITAL_BASED_OUTPATIENT_CLINIC_OR_DEPARTMENT_OTHER): Payer: Self-pay

## 2024-04-13 MED ORDER — FLUZONE 0.5 ML IM SUSY
0.5000 mL | PREFILLED_SYRINGE | Freq: Once | INTRAMUSCULAR | 0 refills | Status: AC
  Filled 2024-04-13: qty 0.5, 1d supply, fill #0

## 2024-04-20 ENCOUNTER — Ambulatory Visit: Attending: Cardiology | Admitting: Cardiology

## 2024-04-20 ENCOUNTER — Encounter: Payer: Self-pay | Admitting: Cardiology

## 2024-04-20 VITALS — BP 132/78 | HR 77 | Ht 69.0 in | Wt 287.8 lb

## 2024-04-20 DIAGNOSIS — I517 Cardiomegaly: Secondary | ICD-10-CM

## 2024-04-20 DIAGNOSIS — R002 Palpitations: Secondary | ICD-10-CM

## 2024-04-20 NOTE — Patient Instructions (Signed)
 Medication Instructions:   Continue all current medications.   Labwork:  none  Testing/Procedures:  Your physician has requested that you have an echocardiogram. Echocardiography is a painless test that uses sound waves to create images of your heart. It provides your doctor with information about the size and shape of your heart and how well your heart's chambers and valves are working. This procedure takes approximately one hour. There are no restrictions for this procedure. Please do NOT wear cologne, perfume, aftershave, or lotions (deodorant is allowed). Please arrive 15 minutes prior to your appointment time.  Please note: We ask at that you not bring children with you during ultrasound (echo/ vascular) testing. Due to room size and safety concerns, children are not allowed in the ultrasound rooms during exams. Our front office staff cannot provide observation of children in our lobby area while testing is being conducted. An adult accompanying a patient to their appointment will only be allowed in the ultrasound room at the discretion of the ultrasound technician under special circumstances. We apologize for any inconvenience. Office will contact with results via phone, letter or mychart.     Follow-Up:  As needed   Any Other Special Instructions Will Be Listed Below (If Applicable).   If you need a refill on your cardiac medications before your next appointment, please call your pharmacy.

## 2024-04-20 NOTE — Progress Notes (Signed)
 "     Clinical Summary Tanya Galloway is a 59 y.o.female seen today as a new consult, referred by PA Jolee for the following medical problems.   1.Palpitations - started about 3 months ago - would feel palpitations at times, often also her watch would alarm. Alarm would go off high rates while sleeping up to 160 bpm - mild lightheadedness with epsidoes - was drinking coffee x 1 pot, zero sugar sodas x 2, ice tea at least one glass, . No EtoH - has cut back on caffeine about 1 month ago, since then symptoms 90% better.     2. HTN - home bp's 110s/70s - was on bp med in the past, stopped due to low bp's.   3.Cardiomegaly - reproted on prior CT chest scans  Past Medical History:  Diagnosis Date   Diabetes mellitus without complication (HCC)    Diverticulosis    Fatty liver    GERD (gastroesophageal reflux disease)    H/O seasonal allergies    Hemorrhoids    Hypertension    Irritable bowel syndrome    Lung cancer (HCC)      Allergies[1]   Current Outpatient Medications  Medication Sig Dispense Refill   acetaminophen  (TYLENOL ) 500 MG tablet Take 2 tablets (1,000 mg total) by mouth every 6 (six) hours. (Patient taking differently: Take 500-1,000 mg by mouth every 8 (eight) hours as needed for mild pain (pain score 1-3), moderate pain (pain score 4-6) or headache.) 30 tablet 0   B Complex-C (SUPER B COMPLEX PO) Take by mouth daily at 6 (six) AM.     calcipotriene-betamethasone (TACLONEX SCALP) external suspension Apply 6 drops topically daily.     cefUROXime  (CEFTIN ) 250 MG tablet Take 1 tablet (250 mg total) by mouth 2 (two) times daily. 20 tablet 0   Cholecalciferol (VITAMIN D3) 50 MCG (2000 UT) TABS Take 1 tablet by mouth in the morning and at bedtime.     clobetasol  (TEMOVATE ) 0.05 % external solution Apply 1 Application topically to scalp 2 (two) times daily. (Patient taking differently: Apply 1 Application topically daily.) 50 mL 3   Cyanocobalamin  (VITAMIN B-12 CR PO) Take  by mouth daily at 6 (six) AM.     dicyclomine  (BENTYL ) 10 MG capsule TAKE 1 CAPSULE BY MOUTH TWICE DAILY (Patient taking differently: Take 10 mg by mouth daily.) 180 capsule 0   empagliflozin  (JARDIANCE ) 10 MG TABS tablet Take 1 tablet (10 mg total) by mouth daily. 30 tablet 3   esomeprazole  (NEXIUM ) 40 MG capsule Take 40 mg by mouth daily.     glucose blood test strip Use to check blood sugar every day 100 each 3   ketoconazole  (NIZORAL ) 2 % shampoo Wash scalp and allow to sit 3 to 5 minutes before rinsing externally 3 times a week 120 mL 3   Lancets (ONETOUCH DELICA PLUS LANCET30G) MISC USE TO TEST FASTING BLOOD SUGAR D     metFORMIN  (GLUCOPHAGE -XR) 500 MG 24 hr tablet Take 2 tablets (1,000 mg total) by mouth 2 (two) times daily. 360 tablet 1   Pediatric Multiple Vitamins (FLINSTONES GUMMIES OMEGA-3 DHA PO) Take by mouth daily at 6 (six) AM.     clobetasol  (TEMOVATE ) 0.05 % external solution Apply 1 application  topically 2 (two) times daily as needed (psorasis). (Patient not taking: Reported on 04/20/2024)     clobetasol  (TEMOVATE ) 0.05 % external solution Apply 1 Application topically to scalp 2 (two) times daily. (Patient not taking: Reported on 04/20/2024) 50 mL 3  CONTOUR NEXT TEST test strip daily.     Deucravacitinib (SOTYKTU) 6 MG TABS 1 tablet Orally Once a day; Duration: 30 days (Patient not taking: Reported on 04/20/2024)     dicyclomine  (BENTYL ) 10 MG capsule Take 1 capsule (10 mg total) by mouth 2 (two) times daily before a meal. (Patient not taking: Reported on 04/20/2024) 180 capsule 3   empagliflozin  (JARDIANCE ) 10 MG TABS tablet Take 10 mg by mouth daily. (Patient not taking: Reported on 04/20/2024)     ketoconazole  (NIZORAL ) 2 % shampoo Apply 1 application topically 2 (two) times a week.     ketoconazole  (NIZORAL ) 2 % shampoo Lather to the affected area. Rinse after 5 minutes and use every day for 2 weeks for flares then use 3 times a week (Patient not taking: Reported on 04/20/2024)  120 mL 1   lisinopril-hydrochlorothiazide (ZESTORETIC) 20-12.5 MG tablet Take 1 tablet by mouth daily. (Patient not taking: Reported on 04/20/2024)     metFORMIN  (GLUCOPHAGE -XR) 500 MG 24 hr tablet Take 1,000 mg by mouth 2 (two) times daily with a meal. (Patient not taking: Reported on 04/20/2024)  2   No current facility-administered medications for this visit.     Past Surgical History:  Procedure Laterality Date   COLONOSCOPY N/A 03/11/2018   seven 4-6 mm polyps , sigmoid diverticulosis. 4 tubular adenomas 1 sessile serrated polyp and 2 are hyperplastic polyps   KNEE ARTHROSCOPY  04/07/2009   left knee   LOBECTOMY Left 01/11/2019   Procedure: possible LOBECTOMY;  Surgeon: Shyrl Linnie KIDD, MD;  Location: [facility] OR;  Service: Thoracic;  Laterality: Left;   POLYPECTOMY  03/11/2018   Procedure: POLYPECTOMY;  Surgeon: Golda Claudis PENNER, MD;  Location: [facility];  Service: Endoscopy;;  splenic flexure (CSx1), transverse colon (CBx1), hepatic flexure (CSx1), descending colon (CSx1)distal sigmoid colon(CSx3)   VIDEO ASSISTED THORACOSCOPY (VATS)/WEDGE RESECTION Left 01/11/2019   Procedure: VIDEO ASSISTED THORACOSCOPY (VATS)/LEFT UPPER LOBE WEDGE RESECTION;  Surgeon: Shyrl Linnie KIDD, MD;  Location: [facility] OR;  Service: Thoracic;  Laterality: Left;   VIDEO BRONCHOSCOPY N/A 01/11/2019   Procedure: VIDEO BRONCHOSCOPY;  Surgeon: Shyrl Linnie KIDD, MD;  Location: [facility] OR;  Service: Thoracic;  Laterality: N/A;     Allergies[2]    Family History  Problem Relation Age of Onset   Diabetes Mother    Thyroid disease Mother    Hypertension Mother    Hypertension Father    Hypertension Brother    Healthy Brother    Breast cancer Maternal Grandmother    Colon cancer Neg Hx      Social History Tanya Galloway reports that she quit smoking about 14 years ago. Her smoking use included cigarettes. She has never used smokeless tobacco. Tanya Galloway reports current alcohol use.    Physical  Examination Today's Vitals   04/20/24 1359 04/20/24 1423  BP: (!) 146/98 132/78  Pulse: 77   SpO2: 95%   Weight: 287 lb 12.8 oz (130.5 kg)   Height: 5' 9 (1.753 m)    Body mass index is 42.5 kg/m.  Gen: resting comfortably, no acute distress HEENT: no scleral icterus, pupils equal round and reactive, no palptable cervical adenopathy,  CV: RRR, no m/rg, no jvd Resp: Clear to auscultation bilaterally GI: abdomen is soft, non-tender, non-distended, normal bowel sounds, no hepatosplenomegaly MSK: extremities are warm, no edema.  Skin: warm, no rash Neuro:  no focal deficits Psych: appropriate affect    Assessment and Plan  1.Palpitations - symptoms much improved with weaning of  caffeine - will just monitor at this time, if significant recurrence consider zio patch - EKG today shows NSR  2. HTN - off bp meds due to prior low bp's - bp on manual recheck is at goal, continue to monitor  3. Cardioemegaly - reproted on prior chest CT scans. In my experience often inaccurate when comes to assessing cardiac size. She does have lung disease and former smoker, risk for cardiac dysfunction/enlargement - will plan for echo  F/u as needed      Dorn PHEBE Ross, M.D     [1]  Allergies Allergen Reactions   Other Swelling    SOTYKUTU   Sulfa Antibiotics Nausea Only and Swelling    SWELLING REACTION UNSPECIFIED   [2]  Allergies Allergen Reactions   Other Swelling    SOTYKUTU   Sulfa Antibiotics Nausea Only and Swelling    SWELLING REACTION UNSPECIFIED    "

## 2024-04-21 ENCOUNTER — Other Ambulatory Visit (HOSPITAL_BASED_OUTPATIENT_CLINIC_OR_DEPARTMENT_OTHER): Payer: Self-pay

## 2024-04-21 MED ORDER — OSELTAMIVIR PHOSPHATE 75 MG PO CAPS
75.0000 mg | ORAL_CAPSULE | Freq: Two times a day (BID) | ORAL | 0 refills | Status: AC
  Filled 2024-04-21: qty 10, 5d supply, fill #0

## 2024-04-28 ENCOUNTER — Other Ambulatory Visit (HOSPITAL_BASED_OUTPATIENT_CLINIC_OR_DEPARTMENT_OTHER): Payer: Self-pay

## 2024-04-28 MED ORDER — DICYCLOMINE HCL 10 MG PO CAPS
10.0000 mg | ORAL_CAPSULE | Freq: Two times a day (BID) | ORAL | 0 refills | Status: AC
  Filled 2024-04-28: qty 180, 90d supply, fill #0

## 2024-05-04 ENCOUNTER — Ambulatory Visit

## 2024-05-06 ENCOUNTER — Ambulatory Visit: Payer: Self-pay | Admitting: Cardiology

## 2024-05-06 ENCOUNTER — Ambulatory Visit: Attending: Cardiology

## 2024-05-06 DIAGNOSIS — I517 Cardiomegaly: Secondary | ICD-10-CM | POA: Diagnosis not present

## 2024-05-06 LAB — ECHOCARDIOGRAM COMPLETE
AR max vel: 2.43 cm2
AV Area VTI: 2.44 cm2
AV Area mean vel: 2.35 cm2
AV Mean grad: 4.6 mmHg
AV Peak grad: 8.8 mmHg
Ao pk vel: 1.49 m/s
Area-P 1/2: 5.13 cm2
Calc EF: 71.8 %
S' Lateral: 3.7 cm
Single Plane A2C EF: 77 %
Single Plane A4C EF: 67.7 %

## 2024-05-11 ENCOUNTER — Other Ambulatory Visit (HOSPITAL_BASED_OUTPATIENT_CLINIC_OR_DEPARTMENT_OTHER): Payer: Self-pay

## 2024-05-12 ENCOUNTER — Ambulatory Visit

## 2024-12-09 ENCOUNTER — Ambulatory Visit (HOSPITAL_COMMUNITY)

## 2024-12-09 ENCOUNTER — Other Ambulatory Visit

## 2024-12-16 ENCOUNTER — Ambulatory Visit: Admitting: Oncology
